# Patient Record
Sex: Male | Born: 1960
Health system: Southern US, Community
[De-identification: ages and names within clinical notes are randomized; demographics above are authoritative.]

## PROBLEM LIST (undated history)

## (undated) DIAGNOSIS — B1920 Unspecified viral hepatitis C without hepatic coma: Secondary | ICD-10-CM

## (undated) DIAGNOSIS — N200 Calculus of kidney: Secondary | ICD-10-CM

## (undated) HISTORY — DX: Calculus of kidney: N20.0

## (undated) HISTORY — PX: ANTERIOR CRUCIATE LIGAMENT REPAIR: SHX115

## (undated) HISTORY — PX: APPENDECTOMY: SHX54

## (undated) HISTORY — PX: TONSILLECTOMY: SUR1361

## (undated) HISTORY — PX: ABDOMINAL SURGERY: SHX537

## (undated) HISTORY — PX: EYE SURGERY: SHX253

---

## 2000-07-30 ENCOUNTER — Emergency Department (HOSPITAL_COMMUNITY): Admission: EM | Admit: 2000-07-30 | Discharge: 2000-07-30 | Payer: Self-pay | Admitting: Emergency Medicine

## 2000-07-30 ENCOUNTER — Encounter: Payer: Self-pay | Admitting: Emergency Medicine

## 2000-12-03 ENCOUNTER — Encounter: Payer: Self-pay | Admitting: Emergency Medicine

## 2000-12-03 ENCOUNTER — Emergency Department (HOSPITAL_COMMUNITY): Admission: EM | Admit: 2000-12-03 | Discharge: 2000-12-03 | Payer: Self-pay | Admitting: Emergency Medicine

## 2001-02-06 ENCOUNTER — Emergency Department (HOSPITAL_COMMUNITY): Admission: EM | Admit: 2001-02-06 | Discharge: 2001-02-06 | Payer: Self-pay | Admitting: *Deleted

## 2001-11-09 ENCOUNTER — Encounter: Payer: Self-pay | Admitting: Emergency Medicine

## 2001-11-09 ENCOUNTER — Emergency Department (HOSPITAL_COMMUNITY): Admission: EM | Admit: 2001-11-09 | Discharge: 2001-11-09 | Payer: Self-pay | Admitting: Emergency Medicine

## 2017-11-30 ENCOUNTER — Ambulatory Visit (INDEPENDENT_AMBULATORY_CARE_PROVIDER_SITE_OTHER): Payer: Self-pay | Admitting: Urgent Care

## 2017-11-30 ENCOUNTER — Encounter: Payer: Self-pay | Admitting: Urgent Care

## 2017-11-30 ENCOUNTER — Ambulatory Visit (INDEPENDENT_AMBULATORY_CARE_PROVIDER_SITE_OTHER): Payer: BLUE CROSS/BLUE SHIELD

## 2017-11-30 VITALS — BP 124/72 | HR 72 | Temp 98.2°F | Resp 18

## 2017-11-30 DIAGNOSIS — R1031 Right lower quadrant pain: Secondary | ICD-10-CM | POA: Diagnosis not present

## 2017-11-30 DIAGNOSIS — N201 Calculus of ureter: Secondary | ICD-10-CM | POA: Diagnosis not present

## 2017-11-30 DIAGNOSIS — R109 Unspecified abdominal pain: Secondary | ICD-10-CM

## 2017-11-30 LAB — POCT CBC
GRANULOCYTE PERCENT: 50.4 % (ref 37–80)
HCT, POC: 48 % (ref 43.5–53.7)
Hemoglobin: 15.6 g/dL (ref 14.1–18.1)
Lymph, poc: 3.9 — AB (ref 0.6–3.4)
MCH, POC: 29.7 pg (ref 27–31.2)
MCHC: 32.6 g/dL (ref 31.8–35.4)
MCV: 91.3 fL (ref 80–97)
MID (CBC): 0.9 (ref 0–0.9)
MPV: 8.6 fL (ref 0–99.8)
POC GRANULOCYTE: 4.9 (ref 2–6.9)
POC LYMPH PERCENT: 40.3 %L (ref 10–50)
POC MID %: 9.3 % (ref 0–12)
Platelet Count, POC: 224 10*3/uL (ref 142–424)
RBC: 5.25 M/uL (ref 4.69–6.13)
RDW, POC: 13.5 %
WBC: 9.7 10*3/uL (ref 4.6–10.2)

## 2017-11-30 LAB — BASIC METABOLIC PANEL
BUN / CREAT RATIO: 11 (ref 9–20)
BUN: 10 mg/dL (ref 6–24)
CO2: 22 mmol/L (ref 20–29)
Calcium: 9.4 mg/dL (ref 8.7–10.2)
Chloride: 102 mmol/L (ref 96–106)
Creatinine, Ser: 0.93 mg/dL (ref 0.76–1.27)
GFR, EST AFRICAN AMERICAN: 105 mL/min/{1.73_m2} (ref >59)
GFR, EST NON AFRICAN AMERICAN: 91 mL/min/{1.73_m2} (ref >59)
Glucose: 119 mg/dL — ABNORMAL HIGH (ref 65–99)
POTASSIUM: 4.1 mmol/L (ref 3.5–5.2)
SODIUM: 143 mmol/L (ref 134–144)

## 2017-11-30 MED ORDER — SODIUM CHLORIDE 0.9 % IV SOLN: 4.0000 mg | Freq: Once | INTRAVENOUS | Status: DC

## 2017-11-30 MED ORDER — PROMETHAZINE HCL 25 MG/ML IJ SOLN: 25.0000 mg | Freq: Four times a day (QID) | INTRAMUSCULAR | Status: DC | PRN

## 2017-11-30 MED ORDER — KETOROLAC TROMETHAMINE 60 MG/2ML IM SOLN
60.0000 mg | Freq: Once | INTRAMUSCULAR | Status: AC
Start: 2017-11-30 — End: 2017-11-30
  Administered 2017-11-30: 60 mg via INTRAMUSCULAR

## 2017-11-30 MED ORDER — PROMETHAZINE HCL 25 MG/ML IJ SOLN: 25.0000 mg | Freq: Once | INTRAMUSCULAR | 0 refills | Status: DC

## 2017-11-30 MED ORDER — ONDANSETRON 8 MG PO TBDP: 8.0000 mg | ORAL_TABLET | Freq: Three times a day (TID) | ORAL | 0 refills | Status: DC | PRN

## 2017-11-30 MED ORDER — TAMSULOSIN HCL 0.4 MG PO CAPS: 0.4000 mg | ORAL_CAPSULE | Freq: Every day | ORAL | 3 refills | Status: DC

## 2017-11-30 MED ORDER — ONDANSETRON HCL 4 MG/2ML IJ SOLN
4.0000 mg | Freq: Once | INTRAMUSCULAR | Status: AC
  Administered 2017-11-30: 4 mg via INTRAVENOUS

## 2017-11-30 MED ORDER — HYDROCODONE-ACETAMINOPHEN 5-325 MG PO TABS: 1.0000 | ORAL_TABLET | Freq: Four times a day (QID) | ORAL | 0 refills | Status: DC | PRN

## 2017-11-30 NOTE — Progress Notes (Signed)
MRN: [id number] DOB: 03/05/1961  Subjective:   Randall York is a 57 y.o. male presenting for acute onset of severe right-sided flank pain that radiates anteriorly the lateral and lower right abdomen.  Symptoms started couple of hours ago and have progressively worsened, has associated nausea with vomiting.  Patient reports a history of 2 renal stones that were small, believes that he passed them.  Denies fever, dysuria, anuria, oliguria, hematuria, chest pain, dizziness, confusion, palpitations.  Patient reports that his pain is 10 out of 10, the worst is ever experience.  He is not currently smoking cigarettes or drinking alcohol.  He has not tried any medications for relief.  Denies history of heart disease, lung disease, GI disease.  [name] is not currently taking any medications and also has No Known Allergies.  [name]  has a past medical history of Kidney stone.  Denies past surgical history. Denies family history of cancer, diabetes, HTN, HL, heart disease, stroke, mental illness.   Objective:   Vitals: BP 124/72   Pulse 72   Temp 98.2 F (36.8 C) (Oral)   Resp 18   SpO2 96%   Physical Exam  Constitutional: He is oriented to person, place, and time. He appears well-developed and well-nourished. He appears distressed (from his pain).  HENT:  Mucous membranes dry.  Eyes: Right eye exhibits no discharge. Left eye exhibits no discharge. No scleral icterus.  Cardiovascular: Normal rate, regular rhythm, normal heart sounds and intact distal pulses. Exam reveals no gallop and no friction rub.  No murmur heard. Pulmonary/Chest: Effort normal and breath sounds normal. No stridor. No respiratory distress. He has no wheezes. He has no rales.  Abdominal: Soft. Bowel sounds are normal. He exhibits no distension and no mass. There is tenderness (right flank, right mid-lower side). There is no rebound and no guarding.  Neurological: He is alert and oriented to person, place, and time.    Skin: Skin is warm and dry.   No results found for this or any previous visit (from the past 24 hour(s)). Dg Abd 1 View  Result Date: 12/02/2017 CLINICAL DATA:  57 y/o  M; right flank pain. EXAM: ABDOMEN - 1 VIEW COMPARISON:  11/30/2017 abdomen radiograph FINDINGS: 4 mm density in the right hemipelvis may represent the probable right-sided ureteral calculus identified on a prior abdomen radiographs, now migrated to the distal ureter. Normal bowel gas pattern. Bones are unremarkable. IMPRESSION: 4 mm density in the right hemipelvis may represent the probable right-sided ureteral calculus identified on a prior abdomen radiographs, now migrated to the distal ureter. Electronically Signed   By: Mitzi Hansen M.D.   On: 12/02/2017 18:03    Assessment and Plan :   Right ureteral stone  Right lower quadrant abdominal pain - Plan: ketorolac (TORADOL) injection 60 mg, ondansetron (ZOFRAN) injection 4 mg, promethazine (PHENERGAN) 25 MG/ML injection, DISCONTINUED: promethazine (PHENERGAN) injection 25 mg, DISCONTINUED: ondansetron (ZOFRAN) 4 mg in sodium chloride 0.9 % 50 mL IVPB  Right flank pain - Plan: DG Abd 1 View, POCT CBC, Basic metabolic panel  Patient stabilized with IM Toradol, Phenergan and Zofran.  His pain improved throughout the visit as well with the use of IV fluids.  Counseled on management of her renal stone including Flomax, aggressive hydration, hydrocodone for pain.  Patient is to strain his urine and return to clinic if he passes the stone for analysis.  ER return to clinic precautions reviewed.  Wallis Bamberg, PA-C Primary Care at [facility] [phone] 12/03/2017  7:42 AM

## 2017-11-30 NOTE — Patient Instructions (Addendum)
Kidney Stones Kidney stones (urolithiasis) are solid, rock-like deposits that form inside of the organs that make urine (kidneys). A kidney stone may form in a kidney and move into the bladder, where it can cause intense pain and block the flow of urine. Kidney stones are created when high levels of certain minerals are found in the urine. They are usually passed through urination, but in some cases, medical treatment may be needed to remove them. What are the causes? Kidney stones may be caused by:  A condition in which certain glands produce too much parathyroid hormone (primary hyperparathyroidism), which causes too much calcium buildup in the blood.  Buildup of uric acid crystals in the bladder (hyperuricosuria). Uric acid is a chemical that the body produces when you eat certain foods. It usually exits the body in the urine.  Narrowing (stricture) of one or both of the tubes that drain urine from the kidneys to the bladder (ureters).  A kidney blockage that is present at birth (congenital obstruction).  Past surgery on the kidney or the ureters, such as gastric bypass surgery.  What increases the risk? The following factors make you more likely to develop kidney stones:  Having had a kidney stone in the past.  Having a family history of kidney stones.  Not drinking enough water.  Eating a diet that is high in protein, salt (sodium), or sugar.  Being overweight or obese.  What are the signs or symptoms? Symptoms of a kidney stone may include:  Nausea.  Vomiting.  Blood in the urine (hematuria).  Pain in the side of the abdomen, right below the ribs (flank pain). Pain usually spreads (radiates) to the groin.  Needing to urinate frequently or urgently.  How is this diagnosed? This condition may be diagnosed based on:  Your medical history.  A physical exam.  Blood tests.  Urine tests.  CT scan.  Abdominal X-ray.  A procedure to examine the inside of the  bladder (cystoscopy).  How is this treated? Treatment for kidney stones depends on the size, location, and makeup of the stones. Treatment may involve:  Analyzing your urine before and after you pass the stone through urination.  Being monitored at the hospital until you pass the stone through urination.  Increasing your fluid intake and decreasing the amount of calcium and protein in your diet.  A procedure to break up kidney stones in the bladder using: ? A focused beam of light (laser therapy). ? Shock waves (extracorporeal shock wave lithotripsy).  Surgery to remove kidney stones. This may be needed if you have severe pain or have stones that block your urinary tract.  Follow these instructions at home: Eating and drinking   Drink enough fluid to keep your urine clear or pale yellow. This will help you to pass the kidney stone.  If directed, change your diet. This may include: ? Limiting how much sodium you eat. ? Eating more fruits and vegetables. ? Limiting how much meat, poultry, fish, and eggs you eat.  Follow instructions from your health care provider about eating or drinking restrictions. General instructions  Collect urine samples as told by your health care provider. You may need to collect a urine sample: ? 24 hours after you pass the stone. ? 8-12 weeks after passing the kidney stone, and every 6-12 months after that.  Strain your urine every time you urinate, for as long as directed. Use the strainer that your health care provider recommends.  Do not throw out   the kidney stone after passing it. Keep the stone so it can be tested by your health care provider. Testing the makeup of your kidney stone may help prevent you from getting kidney stones in the future.  Take over-the-counter and prescription medicines only as told by your health care provider.  Keep all follow-up visits as told by your health care provider. This is important. You may need follow-up  X-rays or ultrasounds to make sure that your stone has passed. How is this prevented? To prevent another kidney stone:  Drink enough fluid to keep your urine clear or pale yellow. This is the best way to prevent kidney stones.  Eat a healthy diet and follow recommendations from your health care provider about foods to avoid. You may be instructed to eat a low-protein diet. Recommendations vary depending on the type of kidney stone that you have.  Maintain a healthy weight.  Contact a health care provider if:  You have pain that gets worse or does not get better with medicine. Get help right away if:  You have a fever or chills.  You develop severe pain.  You develop new abdominal pain.  You faint.  You are unable to urinate. This information is not intended to replace advice given to you by your health care provider. Make sure you discuss any questions you have with your health care provider. Document Released: 04/12/2005 Document Revised: 10/31/2015 Document Reviewed: 09/26/2015 Elsevier Interactive Patient Education  2018 [employer].     Dietary Guidelines to Help Prevent Kidney Stones Kidney stones are deposits of minerals and salts that form inside your kidneys. Your risk of developing kidney stones may be greater depending on your diet, your lifestyle, the medicines you take, and whether you have certain medical conditions. Most people can reduce their chances of developing kidney stones by following the instructions below. Depending on your overall health and the type of kidney stones you tend to develop, your dietitian may give you more specific instructions. What are tips for following this plan? Reading food labels  Choose foods with "no salt added" or "low-salt" labels. Limit your sodium intake to less than 1500 mg per day.  Choose foods with calcium for each meal and snack. Try to eat about 300 mg of calcium at each meal. Foods that contain 200-500 mg of calcium  per serving include: ? 8 oz (237 ml) of milk, fortified nondairy milk, and fortified fruit juice. ? 8 oz (237 ml) of kefir, yogurt, and soy yogurt. ? 4 oz (118 ml) of tofu. ? 1 oz of cheese. ? 1 cup (300 g) of dried figs. ? 1 cup (91 g) of cooked broccoli. ? 1-3 oz can of sardines or mackerel.  Most people need 1000 to 1500 mg of calcium each day. Talk to your dietitian about how much calcium is recommended for you. Shopping  Buy plenty of fresh fruits and vegetables. Most people do not need to avoid fruits and vegetables, even if they contain nutrients that may contribute to kidney stones.  When shopping for convenience foods, choose: ? Whole pieces of fruit. ? Premade salads with dressing on the side. ? Low-fat fruit and yogurt smoothies.  Avoid buying frozen meals or prepared deli foods.  Look for foods with live cultures, such as yogurt and kefir. Cooking  Do not add salt to food when cooking. Place a salt shaker on the table and allow each person to add his or her own salt to taste.  Use vegetable  protein, such as beans, textured vegetable protein (TVP), or tofu instead of meat in pasta, casseroles, and soups. Meal planning  Eat less salt, if told by your dietitian. To do this: ? Avoid eating processed or premade food. ? Avoid eating fast food.  Eat less animal protein, including cheese, meat, poultry, or fish, if told by your dietitian. To do this: ? Limit the number of times you have meat, poultry, fish, or cheese each week. Eat a diet free of meat at least 2 days a week. ? Eat only one serving each day of meat, poultry, fish, or seafood. ? When you prepare animal protein, cut pieces into small portion sizes. For most meat and fish, one serving is about the size of one deck of cards.  Eat at least 5 servings of fresh fruits and vegetables each day. To do this: ? Keep fruits and vegetables on hand for snacks. ? Eat 1 piece of fruit or a handful of berries with  breakfast. ? Have a salad and fruit at lunch. ? Have two kinds of vegetables at dinner.  Limit foods that are high in a substance called oxalate. These include: ? Spinach. ? Rhubarb. ? Beets. ? Potato chips and french fries. ? Nuts.  If you regularly take a diuretic medicine, make sure to eat at least 1-2 fruits or vegetables high in potassium each day. These include: ? Avocado. ? Banana. ? Orange, prune, carrot, or tomato juice. ? Baked potato. ? Cabbage. ? Beans and split peas. General instructions  Drink enough fluid to keep your urine clear or pale yellow. This is the most important thing you can do.  Talk to your health care provider and dietitian about taking daily supplements. Depending on your health and the cause of your kidney stones, you may be advised: ? Not to take supplements with vitamin C. ? To take a calcium supplement. ? To take a daily probiotic supplement. ? To take other supplements such as magnesium, fish oil, or vitamin B6.  Take all medicines and supplements as told by your health care provider.  Limit alcohol intake to no more than 1 drink a day for nonpregnant women and 2 drinks a day for men. One drink equals 12 oz of beer, 5 oz of wine, or 1 oz of hard liquor.  Lose weight if told by your health care provider. Work with your dietitian to find strategies and an eating plan that works best for you. What foods are not recommended? Limit your intake of the following foods, or as told by your dietitian. Talk to your dietitian about specific foods you should avoid based on the type of kidney stones and your overall health. Grains Breads. Bagels. Rolls. Baked goods. Salted crackers. Cereal. Pasta. Vegetables Spinach. Rhubarb. Beets. Canned vegetables. Rosita Fire. Olives. Meats and other protein foods Nuts. Nut butters. Large portions of meat, poultry, or fish. Salted or cured meats. Deli meats. Hot dogs. Sausages. Dairy Cheese. Beverages Regular soft  drinks. Regular vegetable juice. Seasonings and other foods Seasoning blends with salt. Salad dressings. Canned soups. Soy sauce. Ketchup. Barbecue sauce. Canned pasta sauce. Casseroles. Pizza. Lasagna. Frozen meals. Potato chips. [place] fries. Summary  You can reduce your risk of kidney stones by making changes to your diet.  The most important thing you can do is drink enough fluid. You should drink enough fluid to keep your urine clear or pale yellow.  Ask your health care provider or dietitian how much protein from animal sources you should eat  each day, and also how much salt and calcium you should have each day. This information is not intended to replace advice given to you by your health care provider. Make sure you discuss any questions you have with your health care provider. Document Released: 08/07/2010 Document Revised: 03/23/2016 Document Reviewed: 03/23/2016 Elsevier Interactive Patient Education  2018 [employer]     IF you received an x-ray today, you will receive an invoice from [facility]. Please contact [facility] at [phone] with questions or concerns regarding your invoice.   IF you received labwork today, you will receive an invoice from [facility]. Please contact [facility] at [phone] with questions or concerns regarding your invoice.   Our billing staff will not be able to assist you with questions regarding bills from these companies.  You will be contacted with the lab results as soon as they are available. The fastest way to get your results is to activate your My Chart account. Instructions are located on the last page of this paperwork. If you have not heard from us regarding the results in 2 weeks, please contact this office.

## 2017-12-02 ENCOUNTER — Other Ambulatory Visit: Payer: Self-pay

## 2017-12-02 ENCOUNTER — Ambulatory Visit (INDEPENDENT_AMBULATORY_CARE_PROVIDER_SITE_OTHER): Payer: BLUE CROSS/BLUE SHIELD

## 2017-12-02 ENCOUNTER — Ambulatory Visit: Payer: BLUE CROSS/BLUE SHIELD | Admitting: Family Medicine

## 2017-12-02 ENCOUNTER — Encounter: Payer: Self-pay | Admitting: Family Medicine

## 2017-12-02 VITALS — BP 138/79 | HR 85 | Temp 98.7°F | Resp 16 | Ht 74.0 in | Wt 255.0 lb

## 2017-12-02 DIAGNOSIS — N201 Calculus of ureter: Secondary | ICD-10-CM

## 2017-12-02 DIAGNOSIS — R109 Unspecified abdominal pain: Secondary | ICD-10-CM

## 2017-12-02 DIAGNOSIS — R1031 Right lower quadrant pain: Secondary | ICD-10-CM

## 2017-12-02 LAB — POCT URINALYSIS DIP (MANUAL ENTRY)
BILIRUBIN UA: NEGATIVE
GLUCOSE UA: NEGATIVE mg/dL
Ketones, POC UA: NEGATIVE mg/dL
LEUKOCYTES UA: NEGATIVE
Nitrite, UA: NEGATIVE
PH UA: 6 (ref 5.0–8.0)
Protein Ur, POC: NEGATIVE mg/dL
Spec Grav, UA: 1.02 (ref 1.010–1.025)
Urobilinogen, UA: 1 E.U./dL

## 2017-12-02 LAB — POC MICROSCOPIC URINALYSIS (UMFC): Mucus: ABSENT

## 2017-12-02 MED ORDER — KETOROLAC TROMETHAMINE 10 MG PO TABS: 10.0000 mg | ORAL_TABLET | Freq: Four times a day (QID) | ORAL | 0 refills | Status: DC | PRN

## 2017-12-02 MED ORDER — KETOROLAC TROMETHAMINE 60 MG/2ML IM SOLN
60.0000 mg | Freq: Once | INTRAMUSCULAR | Status: AC
Start: 2017-12-02 — End: 2017-12-02
  Administered 2017-12-02: 60 mg via INTRAMUSCULAR

## 2017-12-02 MED ORDER — OXYCODONE-ACETAMINOPHEN 10-325 MG PO TABS: 1.0000 | ORAL_TABLET | ORAL | 0 refills | Status: DC | PRN

## 2017-12-02 NOTE — Progress Notes (Signed)
Subjective:    Patient ID: Randall York, male    DOB: 09/22/1960, 57 y.o.   MRN: [id number] Chief Complaint  Patient presents with  . Follow-up    per pt not" hurting as bad", but still hurting on the right side of back. I haven't passed the kidney stone yet. this am around 3:00 am got that pain. Pain scale 6-7, no where near it was the day that I came in. seen on 11/30/17    HPI  Prev diagnosed kidney stone still very bad pain.  Past Medical History:  Diagnosis Date  . Kidney stone    Past Surgical History:  Procedure Laterality Date  . ABDOMINAL SURGERY    . ANTERIOR CRUCIATE LIGAMENT REPAIR    . APPENDECTOMY    . CYSTOSCOPY WITH RETROGRADE PYELOGRAM, URETEROSCOPY AND STENT PLACEMENT Right 12/08/2017   Procedure: CYSTOSCOPY WITH RIGHT RETROGRADE URETEROSCOPY AND STENT PLACEMENT;  Surgeon: Bjorn Pippin, MD;  Location: [facility];  Service: Urology;  Laterality: Right;  . EYE SURGERY    . TONSILLECTOMY     Current Outpatient Medications on File Prior to Visit  Medication Sig Dispense Refill  . tamsulosin (FLOMAX) 0.4 MG CAPS capsule Take 1 capsule (0.4 mg total) by mouth daily. 30 capsule 3   No current facility-administered medications on file prior to visit.    No Known Allergies Family History  Problem Relation Age of Onset  . Kidney Stones Mother    Social History   Socioeconomic History  . Marital status: Married    Spouse name: Not on file  . Number of children: Not on file  . Years of education: Not on file  . Highest education level: Not on file  Occupational History  . Not on file  Social Needs  . Financial resource strain: Not on file  . Food insecurity:    Worry: Not on file    Inability: Not on file  . Transportation needs:    Medical: Not on file    Non-medical: Not on file  Tobacco Use  . Smoking status: Never Smoker  . Smokeless tobacco: Never Used  Substance and Sexual Activity  . Alcohol use: Not Currently  . Drug use:  Never  . Sexual activity: Not on file  Lifestyle  . Physical activity:    Days per week: Not on file    Minutes per session: Not on file  . Stress: Not on file  Relationships  . Social connections:    Talks on phone: Not on file    Gets together: Not on file    Attends religious service: Not on file    Active member of club or organization: Not on file    Attends meetings of clubs or organizations: Not on file    Relationship status: Not on file  Other Topics Concern  . Not on file  Social History Narrative  . Not on file   Depression screen [facility] 2/9 12/02/2017  Decreased Interest 0  Down, Depressed, Hopeless 0  PHQ - 2 Score 0   Review of Systems See hpi    Objective:   Physical Exam  Constitutional: He appears well-developed and well-nourished. He does not have a sickly appearance. He does not appear ill. He appears distressed.  HENT:  Head: Normocephalic and atraumatic.  Neck: Normal range of motion. Neck supple. No thyromegaly present.  Cardiovascular: Normal rate, regular rhythm and normal heart sounds.  Pulmonary/Chest: Effort normal and breath sounds normal.  Abdominal: Soft.  Normal appearance and bowel sounds are normal. He exhibits no distension and no mass. There is no tenderness. There is no rebound, no guarding and no CVA tenderness. No hernia.  Genitourinary: Rectum normal and prostate normal. Rectal exam shows no tenderness, anal tone normal and guaiac negative stool.  Lymphadenopathy:    He has no cervical adenopathy.  Skin: He is not diaphoretic.      BP 138/79 (BP Location: Right Arm, Patient Position: Sitting, Cuff Size: Normal)   Pulse 85   Temp 98.7 F (37.1 C) (Oral)   Resp 16   Ht 6\' 2"  (1.88 m )   Wt 255 lb (115.7 kg)   SpO2 95%   BMI 32.74 kg/m    CLINICAL DATA:  57 y/o  M; right flank pain.  EXAM: ABDOMEN - 1 VIEW  COMPARISON:  11/30/2017 abdomen radiograph  FINDINGS: 4 mm density in the right hemipelvis may represent the  probable right-sided ureteral calculus identified on a prior abdomen radiographs, now migrated to the distal ureter. Normal bowel gas pattern. Bones are unremarkable.  IMPRESSION: 4 mm density in the right hemipelvis may represent the probable right-sided ureteral calculus identified on a prior abdomen radiographs, now migrated to the distal ureter.   Electronically Signed   By: Mitzi HansenLance  Furusawa-Stratton M.D.   On: 12/02/2017 18:03  Assessment & Plan:   1. Right ureteral stone   2. Right lower quadrant abdominal pain   3. Acute right flank pain     Orders Placed This Encounter  Procedures  . Urine Culture  . Stone analysis    Standing Status:   Future    Standing Expiration Date:   12/03/2018  . DG Abd 1 View    Standing Status:   Future    Number of Occurrences:   1    Standing Expiration Date:   12/02/2018    Order Specific Question:   Reason for Exam (SYMPTOM  OR DIAGNOSIS REQUIRED)    Answer:   right flank pain continuing, recheck on nephrolithiasis progress    Order Specific Question:   Preferred imaging location?    Answer:   External  . POCT urinalysis dipstick  . POCT Microscopic Urinalysis ([facility])    Meds ordered this encounter  Medications  . ketorolac (TORADOL) injection 60 mg  . ketorolac (TORADOL) 10 MG tablet    Sig: Take 1 tablet (10 mg total) by mouth every 6 (six) hours as needed (kidney stone pain).    Dispense:  20 tablet    Refill:  0  . oxyCODONE-acetaminophen (PERCOCET) 10-325 MG tablet    Sig: Take 1 tablet by mouth every 4 (four) hours as needed for up to 4 days for pain.    Dispense:  20 tablet    Refill:  0     Norberto SorensonEva Shaw, M.D.  Primary Care at [facility]  [place] [address] [place], [place] [address] [phone] phone [phone] fax  12/02/17 5:45 PM

## 2017-12-02 NOTE — Patient Instructions (Addendum)
Do NOT take the hydrocodone.  Start the ketorolac tomorrow morning. Take the oxycodone as needed - it is 3-4x stronger than the hydrocodone.   IF you received an x-ray today, you will receive an invoice from [facility]. Please contact [facility] at [phone] with questions or concerns regarding your invoice.   IF you received labwork today, you will receive an invoice from [facility]. Please contact [facility] at [phone] with questions or concerns regarding your invoice.   Our billing staff will not be able to assist you with questions regarding bills from these companies.  You will be contacted with the lab results as soon as they are available. The fastest way to get your results is to activate your My Chart account. Instructions are located on the last page of this paperwork. If you have not heard from us regarding the results in 2 weeks, please contact this office.     Kidney Stones Kidney stones (urolithiasis) are solid, rock-like deposits that form inside of the organs that make urine (kidneys). A kidney stone may form in a kidney and move into the bladder, where it can cause intense pain and block the flow of urine. Kidney stones are created when high levels of certain minerals are found in the urine. They are usually passed through urination, but in some cases, medical treatment may be needed to remove them. What are the causes? Kidney stones may be caused by:  A condition in which certain glands produce too much parathyroid hormone (primary hyperparathyroidism), which causes too much calcium buildup in the blood.  Buildup of uric acid crystals in the bladder (hyperuricosuria). Uric acid is a chemical that the body produces when you eat certain foods. It usually exits the body in the urine.  Narrowing (stricture) of one or both of the tubes that drain urine from the kidneys to the bladder (ureters).  A kidney blockage that is present at birth (congenital  obstruction).  Past surgery on the kidney or the ureters, such as gastric bypass surgery.  What increases the risk? The following factors make you more likely to develop kidney stones:  Having had a kidney stone in the past.  Having a family history of kidney stones.  Not drinking enough water.  Eating a diet that is high in protein, salt (sodium), or sugar.  Being overweight or obese.  What are the signs or symptoms? Symptoms of a kidney stone may include:  Nausea.  Vomiting.  Blood in the urine (hematuria).  Pain in the side of the abdomen, right below the ribs (flank pain). Pain usually spreads (radiates) to the groin.  Needing to urinate frequently or urgently.  How is this diagnosed? This condition may be diagnosed based on:  Your medical history.  A physical exam.  Blood tests.  Urine tests.  CT scan.  Abdominal X-ray.  A procedure to examine the inside of the bladder (cystoscopy).  How is this treated? Treatment for kidney stones depends on the size, location, and makeup of the stones. Treatment may involve:  Analyzing your urine before and after you pass the stone through urination.  Being monitored at the hospital until you pass the stone through urination.  Increasing your fluid intake and decreasing the amount of calcium and protein in your diet.  A procedure to break up kidney stones in the bladder using: ? A focused beam of light (laser therapy). ? Shock waves (extracorporeal shock wave lithotripsy).  Surgery to remove kidney stones. This may be needed if you  have severe pain or have stones that block your urinary tract.  Follow these instructions at home: Eating and drinking   Drink enough fluid to keep your urine clear or pale yellow. This will help you to pass the kidney stone.  If directed, change your diet. This may include: ? Limiting how much sodium you eat. ? Eating more fruits and vegetables. ? Limiting how much meat,  poultry, fish, and eggs you eat.  Follow instructions from your health care provider about eating or drinking restrictions. General instructions  Collect urine samples as told by your health care provider. You may need to collect a urine sample: ? 24 hours after you pass the stone. ? 8-12 weeks after passing the kidney stone, and every 6-12 months after that.  Strain your urine every time you urinate, for as long as directed. Use the strainer that your health care provider recommends.  Do not throw out the kidney stone after passing it. Keep the stone so it can be tested by your health care provider. Testing the makeup of your kidney stone may help prevent you from getting kidney stones in the future.  Take over-the-counter and prescription medicines only as told by your health care provider.  Keep all follow-up visits as told by your health care provider. This is important. You may need follow-up X-rays or ultrasounds to make sure that your stone has passed. How is this prevented? To prevent another kidney stone:  Drink enough fluid to keep your urine clear or pale yellow. This is the best way to prevent kidney stones.  Eat a healthy diet and follow recommendations from your health care provider about foods to avoid. You may be instructed to eat a low-protein diet. Recommendations vary depending on the type of kidney stone that you have.  Maintain a healthy weight.  Contact a health care provider if:  You have pain that gets worse or does not get better with medicine. Get help right away if:  You have a fever or chills.  You develop severe pain.  You develop new abdominal pain.  You faint.  You are unable to urinate. This information is not intended to replace advice given to you by your health care provider. Make sure you discuss any questions you have with your health care provider. Document Released: 04/12/2005 Document Revised: 10/31/2015 Document Reviewed:  09/26/2015 Elsevier Interactive Patient Education  2018 [employer].  Dietary Guidelines to Help Prevent Kidney Stones Kidney stones are deposits of minerals and salts that form inside your kidneys. Your risk of developing kidney stones may be greater depending on your diet, your lifestyle, the medicines you take, and whether you have certain medical conditions. Most people can reduce their chances of developing kidney stones by following the instructions below. Depending on your overall health and the type of kidney stones you tend to develop, your dietitian may give you more specific instructions. What are tips for following this plan? Reading food labels  Choose foods with "no salt added" or "low-salt" labels. Limit your sodium intake to less than 1500 mg per day.  Choose foods with calcium for each meal and snack. Try to eat about 300 mg of calcium at each meal. Foods that contain 200-500 mg of calcium per serving include: ? 8 oz (237 ml) of milk, fortified nondairy milk, and fortified fruit juice. ? 8 oz (237 ml) of kefir, yogurt, and soy yogurt. ? 4 oz (118 ml) of tofu. ? 1 oz of cheese. ? 1 cup (  300 g) of dried figs. ? 1 cup (91 g) of cooked broccoli. ? 1-3 oz can of sardines or mackerel.  Most people need 1000 to 1500 mg of calcium each day. Talk to your dietitian about how much calcium is recommended for you. Shopping  Buy plenty of fresh fruits and vegetables. Most people do not need to avoid fruits and vegetables, even if they contain nutrients that may contribute to kidney stones.  When shopping for convenience foods, choose: ? Whole pieces of fruit. ? Premade salads with dressing on the side. ? Low-fat fruit and yogurt smoothies.  Avoid buying frozen meals or prepared deli foods.  Look for foods with live cultures, such as yogurt and kefir. Cooking  Do not add salt to food when cooking. Place a salt shaker on the table and allow each person to add his or her own salt  to taste.  Use vegetable protein, such as beans, textured vegetable protein (TVP), or tofu instead of meat in pasta, casseroles, and soups. Meal planning  Eat less salt, if told by your dietitian. To do this: ? Avoid eating processed or premade food. ? Avoid eating fast food.  Eat less animal protein, including cheese, meat, poultry, or fish, if told by your dietitian. To do this: ? Limit the number of times you have meat, poultry, fish, or cheese each week. Eat a diet free of meat at least 2 days a week. ? Eat only one serving each day of meat, poultry, fish, or seafood. ? When you prepare animal protein, cut pieces into small portion sizes. For most meat and fish, one serving is about the size of one deck of cards.  Eat at least 5 servings of fresh fruits and vegetables each day. To do this: ? Keep fruits and vegetables on hand for snacks. ? Eat 1 piece of fruit or a handful of berries with breakfast. ? Have a salad and fruit at lunch. ? Have two kinds of vegetables at dinner.  Limit foods that are high in a substance called oxalate. These include: ? Spinach. ? Rhubarb. ? Beets. ? Potato chips and french fries. ? Nuts.  If you regularly take a diuretic medicine, make sure to eat at least 1-2 fruits or vegetables high in potassium each day. These include: ? Avocado. ? Banana. ? Orange, prune, carrot, or tomato juice. ? Baked potato. ? Cabbage. ? Beans and split peas. General instructions  Drink enough fluid to keep your urine clear or pale yellow. This is the most important thing you can do.  Talk to your health care provider and dietitian about taking daily supplements. Depending on your health and the cause of your kidney stones, you may be advised: ? Not to take supplements with vitamin C. ? To take a calcium supplement. ? To take a daily probiotic supplement. ? To take other supplements such as magnesium, fish oil, or vitamin B6.  Take all medicines and supplements as  told by your health care provider.  Limit alcohol intake to no more than 1 drink a day for nonpregnant women and 2 drinks a day for men. One drink equals 12 oz of beer, 5 oz of wine, or 1 oz of hard liquor.  Lose weight if told by your health care provider. Work with your dietitian to find strategies and an eating plan that works best for you. What foods are not recommended? Limit your intake of the following foods, or as told by your dietitian. Talk to your dietitian  about specific foods you should avoid based on the type of kidney stones and your overall health. Grains Breads. Bagels. Rolls. Baked goods. Salted crackers. Cereal. Pasta. Vegetables Spinach. Rhubarb. Beets. Canned vegetables. Rosita Fire. Olives. Meats and other protein foods Nuts. Nut butters. Large portions of meat, poultry, or fish. Salted or cured meats. Deli meats. Hot dogs. Sausages. Dairy Cheese. Beverages Regular soft drinks. Regular vegetable juice. Seasonings and other foods Seasoning blends with salt. Salad dressings. Canned soups. Soy sauce. Ketchup. Barbecue sauce. Canned pasta sauce. Casseroles. Pizza. Lasagna. Frozen meals. Potato chips. [place] fries. Summary  You can reduce your risk of kidney stones by making changes to your diet.  The most important thing you can do is drink enough fluid. You should drink enough fluid to keep your urine clear or pale yellow.  Ask your health care provider or dietitian how much protein from animal sources you should eat each day, and also how much salt and calcium you should have each day. This information is not intended to replace advice given to you by your health care provider. Make sure you discuss any questions you have with your health care provider. Document Released: 08/07/2010 Document Revised: 03/23/2016 Document Reviewed: 03/23/2016 Elsevier Interactive Patient Education  [employer].

## 2017-12-03 ENCOUNTER — Encounter: Payer: Self-pay | Admitting: Urgent Care

## 2017-12-03 LAB — URINE CULTURE: ORGANISM ID, BACTERIA: NO GROWTH

## 2017-12-06 ENCOUNTER — Other Ambulatory Visit: Payer: Self-pay

## 2017-12-06 ENCOUNTER — Telehealth: Payer: Self-pay | Admitting: Urgent Care

## 2017-12-06 ENCOUNTER — Emergency Department (HOSPITAL_COMMUNITY)
Admission: EM | Admit: 2017-12-06 | Discharge: 2017-12-06 | Disposition: A | Discharge status: Home / Self Care | Payer: BLUE CROSS/BLUE SHIELD | Attending: Emergency Medicine | Admitting: Emergency Medicine

## 2017-12-06 ENCOUNTER — Encounter (HOSPITAL_COMMUNITY): Payer: Self-pay

## 2017-12-06 ENCOUNTER — Emergency Department (HOSPITAL_COMMUNITY): Payer: BLUE CROSS/BLUE SHIELD

## 2017-12-06 ENCOUNTER — Ambulatory Visit: Payer: Self-pay | Admitting: Urgent Care

## 2017-12-06 DIAGNOSIS — R109 Unspecified abdominal pain: Secondary | ICD-10-CM | POA: Diagnosis not present

## 2017-12-06 DIAGNOSIS — R112 Nausea with vomiting, unspecified: Secondary | ICD-10-CM | POA: Insufficient documentation

## 2017-12-06 DIAGNOSIS — N1330 Unspecified hydronephrosis: Secondary | ICD-10-CM

## 2017-12-06 DIAGNOSIS — N132 Hydronephrosis with renal and ureteral calculous obstruction: Secondary | ICD-10-CM | POA: Diagnosis not present

## 2017-12-06 DIAGNOSIS — N131 Hydronephrosis with ureteral stricture, not elsewhere classified: Secondary | ICD-10-CM | POA: Insufficient documentation

## 2017-12-06 DIAGNOSIS — N134 Hydroureter: Secondary | ICD-10-CM

## 2017-12-06 DIAGNOSIS — N201 Calculus of ureter: Secondary | ICD-10-CM | POA: Diagnosis not present

## 2017-12-06 DIAGNOSIS — R1031 Right lower quadrant pain: Secondary | ICD-10-CM | POA: Diagnosis not present

## 2017-12-06 LAB — BASIC METABOLIC PANEL
Anion gap: 7 (ref 5–15)
BUN: 11 mg/dL (ref 6–20)
CHLORIDE: 103 mmol/L (ref 98–111)
CO2: 27 mmol/L (ref 22–32)
Calcium: 9.1 mg/dL (ref 8.9–10.3)
Creatinine, Ser: 1.29 mg/dL — ABNORMAL HIGH (ref 0.61–1.24)
GFR calc Af Amer: >60 mL/min (ref >60)
GFR calc non Af Amer: 60 mL/min — ABNORMAL LOW (ref >60)
GLUCOSE: 110 mg/dL — AB (ref 70–99)
POTASSIUM: 3.9 mmol/L (ref 3.5–5.1)
Sodium: 137 mmol/L (ref 135–145)

## 2017-12-06 LAB — CBC WITH DIFFERENTIAL/PLATELET
Basophils Absolute: 0 10*3/uL (ref 0.0–0.1)
Basophils Relative: 0 %
EOS PCT: 4 %
Eosinophils Absolute: 0.4 10*3/uL (ref 0.0–0.7)
HCT: 43.9 % (ref 39.0–52.0)
Hemoglobin: 15 g/dL (ref 13.0–17.0)
LYMPHS ABS: 1.9 10*3/uL (ref 0.7–4.0)
LYMPHS PCT: 23 %
MCH: 30.7 pg (ref 26.0–34.0)
MCHC: 34.2 g/dL (ref 30.0–36.0)
MCV: 89.8 fL (ref 78.0–100.0)
MONOS PCT: 14 %
Monocytes Absolute: 1.2 10*3/uL — ABNORMAL HIGH (ref 0.1–1.0)
Neutro Abs: 4.9 10*3/uL (ref 1.7–7.7)
Neutrophils Relative %: 59 %
Platelets: 247 10*3/uL (ref 150–400)
RBC: 4.89 MIL/uL (ref 4.22–5.81)
RDW: 12.6 % (ref 11.5–15.5)
WBC: 8.4 10*3/uL (ref 4.0–10.5)

## 2017-12-06 LAB — URINALYSIS, ROUTINE W REFLEX MICROSCOPIC
BACTERIA UA: NONE SEEN
Bilirubin Urine: NEGATIVE
Glucose, UA: NEGATIVE mg/dL
KETONES UR: 5 mg/dL — AB
Leukocytes, UA: NEGATIVE
Nitrite: NEGATIVE
PH: 6 (ref 5.0–8.0)
Protein, ur: NEGATIVE mg/dL
SPECIFIC GRAVITY, URINE: 1.017 (ref 1.005–1.030)

## 2017-12-06 MED ORDER — ONDANSETRON 4 MG PO TBDP: 4.0000 mg | ORAL_TABLET | Freq: Three times a day (TID) | ORAL | 0 refills | Status: DC | PRN

## 2017-12-06 MED ORDER — ONDANSETRON HCL 4 MG/2ML IJ SOLN
4.0000 mg | Freq: Once | INTRAMUSCULAR | Status: AC
Start: 2017-12-06 — End: 2017-12-06
  Administered 2017-12-06: 4 mg via INTRAVENOUS
  Filled 2017-12-06: qty 2

## 2017-12-06 MED ORDER — OXYCODONE HCL 5 MG PO TABS: 10.0000 mg | ORAL_TABLET | ORAL | 0 refills | Status: AC | PRN

## 2017-12-06 MED ORDER — SODIUM CHLORIDE 0.9 % IV BOLUS
500.0000 mL | Freq: Once | INTRAVENOUS | Status: AC
  Administered 2017-12-06: 500 mL via INTRAVENOUS

## 2017-12-06 MED ORDER — MORPHINE SULFATE (PF) 4 MG/ML IV SOLN
6.0000 mg | Freq: Once | INTRAVENOUS | Status: AC
  Administered 2017-12-06: 6 mg via INTRAVENOUS
  Filled 2017-12-06: qty 2

## 2017-12-06 NOTE — ED Provider Notes (Signed)
[facility] DEPT Provider Note   CSN: [id number] Arrival date & time: 12/06/17  0915     History   Chief Complaint Chief Complaint  Patient presents with  . Flank Pain    HPI Randall York is a 57 y.o. male with history of appendectomy, ruptured ulcer requiring laparotomy, kidney stones, back surgery, is here for evaluation of right flank pain for 1 week.  Pain is severe, initially intermittent but now gradually worsening in intensity and more constant.  Pain intermittently radiates to the right lateral right lower abdomen and into the right testicle.  Associated with nausea and vomiting.  No aggravating factors.  Was seen in outside clinic twice this week for similar pain, he had an x-ray done that showed a right ureteral stone.  He has been on hydrocodone, oxycodone without relief of his pain.  He called the outside clinic this morning and was told to come to the ER.  To previous kidney stones passed on their own and has never required stents or surgery.  He has no saddle anesthesia, dysuria, hematuria, loss of bladder or bowel incontinence or retention, changes in bowel movements, loss of sensation or weakness to his extremities.  No recent falls.  No chest pain or shortness of breath.  HPI  Past Medical History:  Diagnosis Date  . Kidney stone     There are no active problems to display for this patient.   Past Surgical History:  Procedure Laterality Date  . ABDOMINAL SURGERY    . ANTERIOR CRUCIATE LIGAMENT REPAIR    . APPENDECTOMY    . EYE SURGERY          Home Medications    Prior to Admission medications   Medication Sig Start Date End Date Taking? Authorizing Provider  ketorolac (TORADOL) 10 MG tablet Take 1 tablet (10 mg total) by mouth every 6 (six) hours as needed (kidney stone pain). 12/02/17  Yes Shawnee Knapp, MD  tamsulosin (FLOMAX) 0.4 MG CAPS capsule Take 1 capsule (0.4 mg total) by mouth daily. 11/30/17  Yes Jaynee Eagles, PA-C    HYDROcodone-acetaminophen (NORCO/VICODIN) 5-325 MG tablet Take 1 tablet by mouth every 6 (six) hours as needed for moderate pain. Patient not taking: Reported on 12/06/2017 11/30/17   Jaynee Eagles, PA-C  ondansetron (ZOFRAN ODT) 4 MG disintegrating tablet Take 1 tablet (4 mg total) by mouth every 8 (eight) hours as needed for nausea or vomiting. 12/06/17   Kinnie Feil, PA-C  oxyCODONE (ROXICODONE) 5 MG immediate release tablet Take 2 tablets (10 mg total) by mouth every 4 (four) hours as needed for up to 2 days for severe pain. 12/06/17 12/08/17  Kinnie Feil, PA-C  promethazine (PHENERGAN) 25 MG/ML injection Inject 1 mL (25 mg total) into the vein once for 1 dose. 11/30/17 11/30/17  Jaynee Eagles, PA-C    Family History Family History  Problem Relation Age of Onset  . Kidney Stones Mother     Social History Social History   Tobacco Use  . Smoking status: Never Smoker  . Smokeless tobacco: Never Used  Substance Use Topics  . Alcohol use: Not Currently  . Drug use: Never     Allergies   Patient has no known allergies.   Review of Systems Review of Systems  Gastrointestinal: Positive for nausea and vomiting.  Genitourinary: Positive for flank pain.  All other systems reviewed and are negative.    Physical Exam Updated Vital Signs BP (!) 141/78 (BP Location:  Left Arm)   Pulse 89   Temp 98.1 F (36.7 C) (Oral)   Resp 17   Ht '6\' 2"'$  (1.88 m)   Wt 113.4 kg   SpO2 95%   BMI 32.10 kg/m   Physical Exam  Constitutional: He is oriented to person, place, and time. He appears well-developed and well-nourished. No distress.  Looks uncomfortable but non toxic, curled up in fetal position on bed.   HENT:  Head: Normocephalic and atraumatic.  Right Ear: External ear normal.  Left Ear: External ear normal.  Nose: Nose normal.  MMM  Eyes: Conjunctivae and EOM are normal.  Neck: Normal range of motion. Neck supple.  Cardiovascular: Normal rate, regular rhythm and normal  heart sounds.  2+ DP and radial pulses bilaterally  Pulmonary/Chest: Effort normal and breath sounds normal.  Abdominal: Soft. There is tenderness. There is CVA tenderness (right).  No pulsatility. No G/R/R. No suprapubic tenderness. Midline and RLQ surgical incisions noted.   Musculoskeletal: Normal range of motion. He exhibits no deformity.  Neurological: He is alert and oriented to person, place, and time.  Sensation to light touch intact in lower extremities. 5/5 strength with dorsiflexion and plantar flexion bilaterally  Skin: Skin is warm and dry. Capillary refill takes less than 2 seconds.  Psychiatric: He has a normal mood and affect. His behavior is normal. Judgment and thought content normal.  Nursing note and vitals reviewed.    ED Treatments / Results  Labs (all labs ordered are listed, but only abnormal results are displayed) Labs Reviewed  URINALYSIS, ROUTINE W REFLEX MICROSCOPIC - Abnormal; Notable for the following components:      Result Value   Hgb urine dipstick SMALL (*)    Ketones, ur 5 (*)    All other components within normal limits  CBC WITH DIFFERENTIAL/PLATELET - Abnormal; Notable for the following components:   Monocytes Absolute 1.2 (*)    All other components within normal limits  BASIC METABOLIC PANEL - Abnormal; Notable for the following components:   Glucose, Bld 110 (*)    Creatinine, Ser 1.29 (*)    GFR calc non Af Amer 60 (*)    All other components within normal limits    EKG None  Radiology Ct Renal Stone Study  Result Date: 12/06/2017 CLINICAL DATA:  Right flank pain radiating to right lower abdomen for 1 week. History of renal calculi. EXAM: CT ABDOMEN AND PELVIS WITHOUT CONTRAST TECHNIQUE: Multidetector CT imaging of the abdomen and pelvis was performed following the standard protocol without IV contrast. COMPARISON:  Abdomen radiograph 12/02/2017 FINDINGS: Lower chest: Left anterior descending coronary artery atherosclerosis.  Hepatobiliary: There are over 20 scattered fluid density lesions in the liver. Some of these are technically too small to characterize punctate calcification in the dome of the right hepatic lobe noted on image 8/2. Gallbladder unremarkable. Pancreas: Unremarkable Spleen: Unremarkable Adrenals/Urinary Tract: Both adrenal glands appear normal. Mild right hydronephrosis and hydroureter extending down to a 4 mm distal ureteral stone located about 1.1 cm from the right UVJ. 2 mm right mid kidney nonobstructive renal calculus on image 82/4. Stomach/Bowel: Unremarkable Vascular/Lymphatic: Aortoiliac atherosclerotic vascular disease. Reproductive: Unremarkable Other: No supplemental non-categorized findings. Musculoskeletal: Lumbar spondylosis and degenerative disc disease causing foraminal impingement bilaterally at L5-S1 and on the right at L4-5. IMPRESSION: 1. Mild right hydronephrosis and hydroureter associated with a 4 mm right distal ureteral calculus located about 1.1 cm proximal to the right UVJ. 2. 2 mm nonobstructive right mid kidney calculus. 3. Aortic Atherosclerosis (  ICD10-I70.0). Left anterior descending coronary artery atherosclerosis. 4. Lumbar impingement at L4-5 and L5-S1. 5. There are over 20 hypodense scattered relatively homogeneous fluid density lesions in the liver. These are likely cysts although some are technically too small to characterize. Electronically Signed   By: Van Clines M.D.   On: 12/06/2017 11:50    Procedures Procedures (including critical care time)  Medications Ordered in ED Medications  sodium chloride 0.9 % bolus 500 mL (500 mLs Intravenous New Bag/Given 12/06/17 1240)  ondansetron (ZOFRAN) injection 4 mg (4 mg Intravenous Given 12/06/17 1045)  morphine 4 MG/ML injection 6 mg (6 mg Intravenous Given 12/06/17 1045)     Initial Impression / Assessment and Plan / ED Course  I have reviewed the triage vital signs and the nursing notes.  Pertinent labs & imaging  results that were available during my care of the patient were reviewed by me and considered in my medical decision making (see chart for details).  Clinical Course as of Dec 07 1251  Tue Dec 06, 2017  1211 Creatinine(!): 1.29 [CG]  1211 Adrenals/Urinary Tract: Both adrenal glands appear normal. Mild right hydronephrosis and hydroureter extending down to a 4 mm distal ureteral stone located about 1.1 cm from the right UVJ.  2 mm right mid kidney nonobstructive renal calculus on image 82/4.    CT Renal Laren Everts [CG]    Clinical Course User Index [CG] Arlean Hopping   Highest suspicion for renal stone.  Less likely pyelonephritis, UTI.  He has no anterior abd tenderness. H/o appendectomy. No obvious hernias. Doubt dissection or AAA given exam, normotensive.  Will obtain screening labs, UA, CT renal, reassess.  1252: Creatinine minimally elevated.  CT confirms 4 mm distal ureteral stone on the right with associated mild right hydronephrosis and hydroureter.  Patient has been passing urine without difficulty in the ER.  His pain is adequately controlled.  No episodes of emesis here.  Patient is waiting until he cannot tolerate the pain anymore to take his oxycodone, will encourage to take it scheduled.  No indication for admission at this time.  No Sirs criteria met.  Will discharge with oxycodone, Zofran, Flomax, follow-up with urology in 48 hours.  Discussed return precautions.  Patient is in agreement. Final Clinical Impressions(s) / ED Diagnoses   Final diagnoses:  Hydronephrosis of right kidney  Hydroureter, right  Ureterolithiasis    ED Discharge Orders         Ordered    oxyCODONE (ROXICODONE) 5 MG immediate release tablet  Every 4 hours PRN     12/06/17 1245    ondansetron (ZOFRAN ODT) 4 MG disintegrating tablet  Every 8 hours PRN     12/06/17 1247           Kinnie Feil, PA-C 12/06/17 1253    Duffy Bruce, MD 12/08/17 1235

## 2017-12-06 NOTE — Discharge Instructions (Signed)
You were seen in the emergency department for continued right flank pain.  A CT confirms a 4 mm stone in the right ureter with mild associated swelling of the tube in the right kidney.  No signs of infection or significant obstruction.  Ureteral stones can take up to several weeks to completely resolve in past.  Sometimes they require intervention by urology.  Take oxycodone 10 mg every 4 hours as prescribed for the next 48 hours.  Take Zofran for nausea as needed.  Stay well-hydrated.  Continue taking Flomax.  Contact [facility] as soon as possible to schedule an appointment for reevaluation.  Return to the ER for fevers, chills, persistent vomiting despite nausea medicine, inability to urinate, burning with urination.

## 2017-12-06 NOTE — Telephone Encounter (Signed)
I called him to check on him.   See if he was able to get a ride to the ED or if he called 911.  I got his answering machine.   Did not leave a message.

## 2017-12-06 NOTE — Telephone Encounter (Signed)
Called in c/o severe right flank pain.   "I have a kidney stone on the right side".    "I've been seen twice for it and it has not moved".    "I've been in pain since last Tuesday but right now it's really bad even with the pain medicine they gave me to take".  I instructed him to go to the ED.    When asked if he had a way to go he said,  "I will ride my [place]".    I instructed him NOT to drive his [address] [place] to the ED with the severe amount of pain he is in.    He verbalized understanding and said he will call his son.   I let him know if his son can't take him then call 911 but please DO NOT drive your [place].    He is grunting and moaning in pain as he is talking to me.  [facility].  At 8:47am I called him back to see if he got his son.   He said,  "Not yet".   "I'm going to try my neighbor".   I instructed him to call 911 if he can't get his neighbor.   I let him know they can give him pain medication in the ambulance on the way to the hospital.   He verbalized understanding.   "Let me try my neighbor first".       Reason for Disposition . [1] SEVERE pain (e.g., excruciating, scale 8-10) AND [2] not improved after pain medicine  Answer Assessment - Initial Assessment Questions 1. LOCATION: "Where does it hurt?" (e.g., left, right)     Beat me in my kidney in my back on my right. 2. ONSET: "When did the pain start?"     Last Tuesday.    I've been on medication and drinking water.  Been seen twice.   Have kidney stone. 3. SEVERITY: "How bad is the pain?" (e.g., Scale 1-10; mild, moderate, or severe)   - MILD (1-3): doesn't interfere with normal activities    - MODERATE (4-7): interferes with normal activities or awakens from sleep    - SEVERE (8-10): excruciating pain and patient unable to do normal activities (stays in bed)       9 on scale 4. PATTERN: "Does the pain come and go, or is it constant?"      Constant since last Tuesday. 5. CAUSE: "What do you think  is causing the pain?"     Kidney stone.   I know   It's on the x-ray.    6. OTHER SYMPTOMS:  "Do you have any other symptoms?" (e.g., fever, abdominal pain, vomiting, leg weakness, burning with urination, blood in urine)     No urinary problems.   Blood in urine.    7. PREGNANCY:  "Is there any chance you are pregnant?" "When was your last menstrual period?"     N/A  Protocols used: FLANK PAIN-A-AH

## 2017-12-06 NOTE — ED Notes (Signed)
ED Provider at bedside. 

## 2017-12-06 NOTE — ED Triage Notes (Signed)
Patient c/o right flank pain that radiates into the right lower abdomen x 1 week. patient reports that he has a known kidney stone.  patient states he went to [facility] on Tues and Friday of last week for the same.

## 2017-12-07 ENCOUNTER — Encounter (HOSPITAL_BASED_OUTPATIENT_CLINIC_OR_DEPARTMENT_OTHER): Payer: Self-pay | Admitting: *Deleted

## 2017-12-07 ENCOUNTER — Other Ambulatory Visit: Payer: Self-pay | Admitting: Urology

## 2017-12-07 ENCOUNTER — Other Ambulatory Visit: Payer: Self-pay

## 2017-12-07 DIAGNOSIS — N201 Calculus of ureter: Secondary | ICD-10-CM | POA: Diagnosis not present

## 2017-12-07 NOTE — Progress Notes (Signed)
Patient to arrive at 1015. NPO after MN, clear liquds until 0615.may take pain/nausea med if needed AM of surgery with a sip of water. Still needs orders office aware.

## 2017-12-07 NOTE — H&P (Signed)
CC/HPI: I was consulted to assess the patient's right ureteral stone. In the last week he has had pretty consistent right Flank pain. He threw up a few days ago. He does have some intermittent nausea. He was seen in the emergency room and had a CT scan which demonstrated a 4 mm stone almost at the level the right ureterovesical junction. He has passed many stones over the years. He has not been very regular with his pain medication but since yesterday is taking oxycodone 2 tablets every 6 hours with some relief. He has not had fever. He is not prone to urinary tract infections. He has had 2 back operations   By history and review of chart I do not think he has had Toradol. They sent him home on oxycodone   There is no other aggravating or relieving factors  There is no other associated signs and symptoms  The severity of the symptoms is moderate  The symptoms are ongoing and bothersome      ALLERGIES: None   MEDICATIONS: None   GU PSH: None   NON-GU PSH: None   GU PMH: None   NON-GU PMH: None   FAMILY HISTORY: None   SOCIAL HISTORY: None   REVIEW OF SYSTEMS:    GU Review Male:   Patient denies frequent urination, hard to postpone urination, burning/ pain with urination, get up at night to urinate, leakage of urine, stream starts and stops, trouble starting your stream, have to strain to urinate , erection problems, and penile pain.  Gastrointestinal (Upper):   Patient denies nausea, vomiting, and indigestion/ heartburn.  Gastrointestinal (Lower):   Patient denies diarrhea and constipation.  Constitutional:   Patient denies fever, night sweats, weight loss, and fatigue.  Skin:   Patient denies itching and skin rash/ lesion.  Eyes:   Patient denies blurred vision and double vision.  Ears/ Nose/ Throat:   Patient denies sore throat and sinus problems.  Hematologic/Lymphatic:   Patient denies swollen glands and easy bruising.  Cardiovascular:   Patient denies leg swelling and chest  pains.  Respiratory:   Patient denies cough and shortness of breath.  Endocrine:   Patient denies excessive thirst.  Musculoskeletal:   Patient denies back pain and joint pain.  Neurological:   Patient denies headaches and dizziness.  Psychologic:   Patient denies depression and anxiety.   VITAL SIGNS:      12/07/2017 09:41 AM  Weight 250 lb / 113.4 kg  Height 74 in / 187.96 cm  BP 128/73 mmHg  Pulse 54 /min  Temperature 98.3 F / 36.8 C  BMI 32.1 kg/m   GU PHYSICAL EXAMINATION:    Penis: The patient looked uncomfortable. He did not have significant right CVA tenderness or abdominal tenderness. Abdominal examination normal.   MULTI-SYSTEM PHYSICAL EXAMINATION:    Constitutional: Well-nourished. No physical deformities. Normally developed. Good grooming.  Neck: Neck symmetrical, not swollen. Normal tracheal position.  Respiratory: No labored breathing, no use of accessory muscles.   Cardiovascular: Normal temperature, normal extremity pulses, no swelling, no varicosities.  Lymphatic: No enlargement of neck, axillae, groin.  Skin: No paleness, no jaundice, no cyanosis. No lesion, no ulcer, no rash.  Neurologic / Psychiatric: Oriented to time, oriented to place, oriented to person. No depression, no anxiety, no agitation.  Gastrointestinal: No mass, no tenderness, no rigidity, non obese abdomen.  Eyes: Normal conjunctivae. Normal eyelids.  Ears, Nose, Mouth, and Throat: Left ear no scars, no lesions, no masses. Right ear no scars,  no lesions, no masses. Nose no scars, no lesions, no masses. Normal hearing. Normal lips.  Musculoskeletal: Normal gait and station of head and neck.     PAST DATA REVIEWED:  Source Of History:  Patient   PROCEDURES:         KUB - 16109               Urinalysis Dipstick Dipstick Cont'd  Color: Yellow Bilirubin: Neg mg/dL  Appearance: Clear Ketones: Neg mg/dL  Specific Gravity: 6.045 Blood: Neg ery/uL  pH: 5.5 Protein: Neg mg/dL  Glucose: Neg  mg/dL Urobilinogen: 0.2 mg/dL    Nitrites: Neg    Leukocyte Esterase: Neg leu/uL         Ketoralac 60mg  - [id number], [id number] Patient tolerated injection well.   Qty: 60 Adm. By: Ina Homes  Unit: mg Lot No [id number]  Route: IM Exp. Date 05/26/2018  Freq: None Mfgr.: Alvogen  Site: Right Hip         Phenergan 25mg  - [id number], [id number] Patient tolerated injection well.   Qty: 25 Adm. By: Ina Homes  Unit: mg Lot No [id number]  Route: IM Exp. Date 02/24/2019  Freq: None Mfgr.: [place]  Site: Left Hip   ASSESSMENT:      ICD-10 Details  1 GU:   Ureteral calculus - N20.1           Notes:   I was consulted to assess the patient's right ureteral stone. In the last week he has had pretty consistent right Flank pain. He threw up a few days ago. He does have some intermittent nausea. He was seen in the emergency room and had a CT scan which demonstrated a 4 mm stone almost at the level the right ureterovesical junction. He has passed many stones over the years. He has not been very regular with his pain medication but since yesterday is taking oxycodone 2 tablets every 6 hours with some relief. He has not had fever. He is not prone to urinary tract infections. He has had 2 back operations   By history and review of chart I do not think he has had Toradol. They sent him home on oxycodone   The patient had a KUB. I could see the 4 mm stone approximately 1 cm proximal to the right ureteral orifice.   Picture was drawn. Watchful waiting and medical therapy discussed. I find the patient very stoic and the pain is been pretty relentless for approximately 8 days. I went over ureteroscopy with right retrograde and cystoscopy and/or stent and/or laser lithotripsy in detail. Pros cons risks discussed. Success and failure rates discussed. Ureteral injury and sequelae discussed. He would like to proceed and I think he may be a good choice. I agree with Dr. Annabell Howells that based upon the size of the stone and his mild  obesity at lithotripsy would be less ideal tomorrow. The patient was very grateful that Dr. Lacretia Nicks would proceed with the procedure tomorrow. I gave the patient 60 mg IM a Toradol and 25 mg IM of Phenergan. Patient has plenty of pain medication. It is important that the patient uses his strainer between now and surgery so thing knows he passed it. We will also be ordering a stat KUB tomorrow morning to see if he still has the stone. His case is late tomorrow morning    PLAN:           Orders Labs Urine Culture  X-Rays: KUB  Schedule         Document Letter(s):  Created for Patient: Clinical Summary

## 2017-12-08 ENCOUNTER — Ambulatory Visit (HOSPITAL_BASED_OUTPATIENT_CLINIC_OR_DEPARTMENT_OTHER): Payer: BLUE CROSS/BLUE SHIELD | Admitting: Anesthesiology

## 2017-12-08 ENCOUNTER — Ambulatory Visit (HOSPITAL_BASED_OUTPATIENT_CLINIC_OR_DEPARTMENT_OTHER)
Admission: RE | Admit: 2017-12-08 | Discharge: 2017-12-08 | Disposition: A | Discharge status: Home / Self Care | Payer: BLUE CROSS/BLUE SHIELD | Source: Ambulatory Visit | Attending: Urology | Admitting: Urology

## 2017-12-08 ENCOUNTER — Encounter (HOSPITAL_BASED_OUTPATIENT_CLINIC_OR_DEPARTMENT_OTHER)
Admission: RE | Disposition: A | Discharge status: Home / Self Care | Payer: Self-pay | Source: Ambulatory Visit | Attending: Urology

## 2017-12-08 ENCOUNTER — Ambulatory Visit (HOSPITAL_COMMUNITY): Payer: BLUE CROSS/BLUE SHIELD

## 2017-12-08 ENCOUNTER — Encounter (HOSPITAL_BASED_OUTPATIENT_CLINIC_OR_DEPARTMENT_OTHER): Payer: Self-pay | Admitting: Anesthesiology

## 2017-12-08 DIAGNOSIS — N201 Calculus of ureter: Secondary | ICD-10-CM | POA: Insufficient documentation

## 2017-12-08 HISTORY — PX: CYSTOSCOPY WITH RETROGRADE PYELOGRAM, URETEROSCOPY AND STENT PLACEMENT: SHX5789

## 2017-12-08 LAB — CBC
HCT: 41.6 % (ref 39.0–52.0)
HEMOGLOBIN: 14 g/dL (ref 13.0–17.0)
MCH: 30.3 pg (ref 26.0–34.0)
MCHC: 33.7 g/dL (ref 30.0–36.0)
MCV: 90 fL (ref 78.0–100.0)
PLATELETS: 250 10*3/uL (ref 150–400)
RBC: 4.62 MIL/uL (ref 4.22–5.81)
RDW: 12.8 % (ref 11.5–15.5)
WBC: 7 10*3/uL (ref 4.0–10.5)

## 2017-12-08 LAB — BASIC METABOLIC PANEL
Anion gap: 9 (ref 5–15)
BUN: 14 mg/dL (ref 6–20)
CO2: 27 mmol/L (ref 22–32)
CREATININE: 1.38 mg/dL — AB (ref 0.61–1.24)
Calcium: 9 mg/dL (ref 8.9–10.3)
Chloride: 102 mmol/L (ref 98–111)
GFR calc Af Amer: >60 mL/min (ref >60)
GFR, EST NON AFRICAN AMERICAN: 55 mL/min — AB (ref >60)
Glucose, Bld: 112 mg/dL — ABNORMAL HIGH (ref 70–99)
POTASSIUM: 4.1 mmol/L (ref 3.5–5.1)
SODIUM: 138 mmol/L (ref 135–145)

## 2017-12-08 SURGERY — CYSTOURETEROSCOPY, WITH RETROGRADE PYELOGRAM AND STENT INSERTION
Anesthesia: General | Site: Renal | Laterality: Right

## 2017-12-08 MED ORDER — KETOROLAC TROMETHAMINE 30 MG/ML IJ SOLN
INTRAMUSCULAR | Status: DC | PRN
  Administered 2017-12-08: 30 mg via INTRAVENOUS

## 2017-12-08 MED ORDER — PROPOFOL 10 MG/ML IV BOLUS
INTRAVENOUS | Status: AC
  Filled 2017-12-08: qty 40

## 2017-12-08 MED ORDER — LIDOCAINE 2% (20 MG/ML) 5 ML SYRINGE
INTRAMUSCULAR | Status: DC | PRN
  Administered 2017-12-08: 100 mg via INTRAVENOUS

## 2017-12-08 MED ORDER — BELLADONNA ALKALOIDS-OPIUM 16.2-60 MG RE SUPP
RECTAL | Status: AC
  Filled 2017-12-08: qty 1

## 2017-12-08 MED ORDER — PROMETHAZINE HCL 25 MG/ML IJ SOLN
6.2500 mg | INTRAMUSCULAR | Status: DC | PRN
  Filled 2017-12-08: qty 1

## 2017-12-08 MED ORDER — CIPROFLOXACIN IN D5W 400 MG/200ML IV SOLN
400.0000 mg | INTRAVENOUS | Status: AC
  Administered 2017-12-08: 400 mg via INTRAVENOUS
  Filled 2017-12-08: qty 200

## 2017-12-08 MED ORDER — MIDAZOLAM HCL 2 MG/2ML IJ SOLN
INTRAMUSCULAR | Status: DC | PRN
  Administered 2017-12-08: 2 mg via INTRAVENOUS

## 2017-12-08 MED ORDER — OXYCODONE HCL 5 MG PO TABS
5.0000 mg | ORAL_TABLET | Freq: Once | ORAL | Status: DC | PRN
  Filled 2017-12-08: qty 1

## 2017-12-08 MED ORDER — CIPROFLOXACIN IN D5W 400 MG/200ML IV SOLN
INTRAVENOUS | Status: AC
  Filled 2017-12-08: qty 200

## 2017-12-08 MED ORDER — BELLADONNA ALKALOIDS-OPIUM 16.2-60 MG RE SUPP
RECTAL | Status: DC | PRN
  Administered 2017-12-08: 1 via RECTAL

## 2017-12-08 MED ORDER — FENTANYL CITRATE (PF) 100 MCG/2ML IJ SOLN
INTRAMUSCULAR | Status: DC | PRN
  Administered 2017-12-08 (×2): 50 ug via INTRAVENOUS

## 2017-12-08 MED ORDER — ONDANSETRON HCL 4 MG/2ML IJ SOLN
INTRAMUSCULAR | Status: DC | PRN
  Administered 2017-12-08: 4 mg via INTRAVENOUS

## 2017-12-08 MED ORDER — SODIUM CHLORIDE 0.9 % IR SOLN
Status: DC | PRN
  Administered 2017-12-08: 3000 mL

## 2017-12-08 MED ORDER — OXYCODONE HCL 5 MG/5ML PO SOLN
5.0000 mg | Freq: Once | ORAL | Status: DC | PRN
  Filled 2017-12-08: qty 5

## 2017-12-08 MED ORDER — HYDROMORPHONE HCL 1 MG/ML IJ SOLN
0.2500 mg | INTRAMUSCULAR | Status: DC | PRN
  Filled 2017-12-08: qty 0.5

## 2017-12-08 MED ORDER — IOHEXOL 300 MG/ML  SOLN
INTRAMUSCULAR | Status: DC | PRN
  Administered 2017-12-08: 15 mL

## 2017-12-08 MED ORDER — FENTANYL CITRATE (PF) 100 MCG/2ML IJ SOLN
INTRAMUSCULAR | Status: AC
  Filled 2017-12-08: qty 2

## 2017-12-08 MED ORDER — MIDAZOLAM HCL 2 MG/2ML IJ SOLN
INTRAMUSCULAR | Status: AC
  Filled 2017-12-08: qty 2

## 2017-12-08 MED ORDER — PHENAZOPYRIDINE HCL 200 MG PO TABS: 200.0000 mg | ORAL_TABLET | Freq: Three times a day (TID) | ORAL | 0 refills | Status: DC | PRN

## 2017-12-08 MED ORDER — LACTATED RINGERS IV SOLN
INTRAVENOUS | Status: DC
  Administered 2017-12-08: 1000 mL via INTRAVENOUS
  Filled 2017-12-08: qty 1000

## 2017-12-08 MED ORDER — PROPOFOL 10 MG/ML IV BOLUS
INTRAVENOUS | Status: DC | PRN
  Administered 2017-12-08: 200 mg via INTRAVENOUS

## 2017-12-08 MED ORDER — DEXAMETHASONE SODIUM PHOSPHATE 10 MG/ML IJ SOLN
INTRAMUSCULAR | Status: DC | PRN
  Administered 2017-12-08: 10 mg via INTRAVENOUS

## 2017-12-08 SURGICAL SUPPLY — 32 items
BAG DRAIN URO-CYSTO SKYTR STRL (DRAIN) ×2 IMPLANT
BASKET STONE 1.7 NGAGE (UROLOGICAL SUPPLIES) ×2 IMPLANT
BASKET ZERO TIP NITINOL 2.4FR (BASKET) IMPLANT
CATH URET 5FR 28IN CONE TIP (BALLOONS)
CATH URET 5FR 28IN OPEN ENDED (CATHETERS) IMPLANT
CATH URET 5FR 70CM CONE TIP (BALLOONS) IMPLANT
CATH URET DUAL LUMEN 6-10FR 50 (CATHETERS) ×2 IMPLANT
CLOTH BEACON ORANGE TIMEOUT ST (SAFETY) ×2 IMPLANT
ELECT REM PT RETURN 9FT ADLT (ELECTROSURGICAL)
ELECTRODE REM PT RTRN 9FT ADLT (ELECTROSURGICAL) IMPLANT
FIBER LASER FLEXIVA 365 (UROLOGICAL SUPPLIES) IMPLANT
FIBER LASER TRAC TIP (UROLOGICAL SUPPLIES) IMPLANT
GLOVE BIOGEL PI IND STRL 8.5 (GLOVE) ×2 IMPLANT
GLOVE BIOGEL PI INDICATOR 8.5 (GLOVE) ×2
GLOVE INDICATOR 8.5 STRL (GLOVE) ×2 IMPLANT
GLOVE SURG SS PI 8.0 STRL IVOR (GLOVE) ×2 IMPLANT
GOWN STRL REUS W/TWL XL LVL3 (GOWN DISPOSABLE) ×4 IMPLANT
GUIDEWIRE 0.038 PTFE COATED (WIRE) IMPLANT
GUIDEWIRE ANG ZIPWIRE 038X150 (WIRE) IMPLANT
GUIDEWIRE STR DUAL SENSOR (WIRE) ×2 IMPLANT
INFUSOR MANOMETER BAG 3000ML (MISCELLANEOUS) ×2 IMPLANT
IV NS IRRIG 3000ML ARTHROMATIC (IV SOLUTION) ×2 IMPLANT
KIT BALLN UROMAX 15FX4 (MISCELLANEOUS) ×1 IMPLANT
KIT BALLN UROMAX 26 75X4 (MISCELLANEOUS) ×1
KIT TURNOVER CYSTO (KITS) ×2 IMPLANT
MANIFOLD NEPTUNE II (INSTRUMENTS) ×2 IMPLANT
NS IRRIG 500ML POUR BTL (IV SOLUTION) ×2 IMPLANT
PACK CYSTO (CUSTOM PROCEDURE TRAY) ×2 IMPLANT
STENT URET 6FRX26 CONTOUR (STENTS) ×2 IMPLANT
TUBE CONNECTING 12X1/4 (SUCTIONS) ×2 IMPLANT
TUBING UROLOGY SET (TUBING) ×2 IMPLANT
boston scientific uromax kit 15 f x 4 cm ×2 IMPLANT

## 2017-12-08 NOTE — Anesthesia Preprocedure Evaluation (Addendum)
Anesthesia Evaluation  Patient identified by MRN, date of birth, ID band Patient awake    Reviewed: Allergy & Precautions, NPO status , Patient's Chart, lab work & pertinent test results  Airway Mallampati: II  TM Distance: >3 FB Neck ROM: Full    Dental no notable dental hx. (+) Teeth Intact, Dental Advisory Given,    Pulmonary neg pulmonary ROS,    Pulmonary exam normal breath sounds clear to auscultation       Cardiovascular negative cardio ROS Normal cardiovascular exam Rhythm:Regular Rate:Normal     Neuro/Psych negative neurological ROS  negative psych ROS   GI/Hepatic negative GI ROS, Neg liver ROS,   Endo/Other  negative endocrine ROS  Renal/GU Renal diseasenegative Renal ROS  negative genitourinary   Musculoskeletal negative musculoskeletal ROS (+)   Abdominal (+) + obese,   Peds negative pediatric ROS (+)  Hematology negative hematology ROS (+)   Anesthesia Other Findings   Reproductive/Obstetrics negative OB ROS                           Anesthesia Physical Anesthesia Plan  ASA: II  Anesthesia Plan: General   Post-op Pain Management:    Induction: Intravenous  PONV Risk Score and Plan: 2 and Ondansetron and Midazolam  Airway Management Planned: LMA  Additional Equipment:   Intra-op Plan:   Post-operative Plan: Extubation in OR  Informed Consent: I have reviewed the patients History and Physical, chart, labs and discussed the procedure including the risks, benefits and alternatives for the proposed anesthesia with the patient or authorized representative who has indicated his/her understanding and acceptance.   Dental advisory given  Plan Discussed with: CRNA  Anesthesia Plan Comments:         Anesthesia Quick Evaluation

## 2017-12-08 NOTE — Op Note (Signed)
Procedure: 1.  Cystoscopy with right retrograde pyelogram and interpretation. 2.  Right ureteroscopic stone extraction. 3.  Insertion of right double-J stent.  Preop diagnosis: Right distal ureteral stone.  Postop diagnosis: Same.  Surgeon: Dr. Irine Seal.  Anesthesia: General.  Specimen: Stone.  Drain: 6 [place] by 26 cm right contour double-J stent with tether.  EBL: Minimal.  Anesthesia: General.  Indications: The patient is a 57 year old white male with a 3 mm right distal ureteral stone is elected ureteroscopic stone extraction.  Procedure: He was given Cipro.  A general anesthetic was induced.  He was placed in lithotomy position and fitted with PAS hose.  His perineum and genitalia were prepped with Betadine solution and draped usual sterile fashion.  Cystoscopy was performed using a 23 [place] scope and 30 degree lens.  Examination revealed a normal urethra.  The external sphincter was intact.  The prostatic urethra was approximately 2 to 3 cm in length with bilobar hyperplasia with minimal obstruction.  Examination of bladder revealed mild trabeculation and no mucosal lesions.  The ureteral orifices were in the normal anatomic position.  The right ureteral orifice was cannulated with a 5 [place] open end catheter and contrast was instilled.    the right retrograde pyelogram demonstrated a narrow intramural ureter with a filling defects proximal and mild dilation above the filling defect which was consistent with a stone.  A sensor guidewire was then passed through the open end catheter with some resistance by the stone but I was able to advance it to the kidney.  The cystoscope was then removed and the 4.5 French semirigid ureteroscope was then passed alongside the wire.  Initially there was some resistance to passage so the ureteroscope was removed and the 8 French dual-lumen ureteral catheter was passed over the wire however it would not go beyond the intramural ureter and I did  not force it.  The ureteroscope was then reinserted and I was able to guide it alongside the wire to the level of the stone and during this maneuver and noted that the sensor wire had gone submucosal for approximately a centimeter and a half below the level of the stone.  I removed the wire and reinserted it through the scope.  Ureteroscope was then removed and a 4 cm 15 French high-pressure balloon was passed over the wire and the distal ureter was dilated to 18 atm.  The ureteroscope was then reinserted and the stone was readily visualized and grasped with an engage basket and removed.  The ureteroscope was reinserted and the ureter was inspected no significant ureteral injury was identified but I felt stenting was indicated.  The cystoscope was then reinserted over the wire and a 6 [place] by 26 cm contour double-J stent was passed to the kidney under fluoroscopic guidance.  The wire was removed leaving a good coil in the kidney and a good coil in the bladder.  The cystoscope was removed leaving the stent string exiting the urethra.  The bladder was then partially drained prior to removal.  A B&O suppository was placed and the stent string was secured to the patient's penis.  He was taken down from lithotomy position, his anesthetic was reversed and he was moved to recovery room in stable condition.  There were no complications.  He will be instructed to bring the stone to the office.

## 2017-12-08 NOTE — Anesthesia Postprocedure Evaluation (Signed)
Anesthesia Post Note  Patient: Randall York  Procedure(s) Performed: CYSTOSCOPY WITH RIGHT RETROGRADE URETEROSCOPY AND STENT PLACEMENT (Right Renal)     Patient location during evaluation: PACU Anesthesia Type: General Level of consciousness: awake and sedated Pain management: pain level controlled Vital Signs Assessment: post-procedure vital signs reviewed and stable Respiratory status: spontaneous breathing Cardiovascular status: stable Postop Assessment: no apparent nausea or vomiting Anesthetic complications: no    Last Vitals:  Vitals:   12/08/17 1313 12/08/17 1315  BP: 123/68 123/68  Pulse: 69 68  Resp: 19 19  Temp:    SpO2: 95% 93%    Last Pain:  Vitals:   12/08/17 1315  TempSrc:   PainSc: Asleep   Pain Goal: Patients Stated Pain Goal: 8 (12/08/17 1115)               Judah Chevere JR,JOHN Susann GivensFRANKLIN

## 2017-12-08 NOTE — Discharge Instructions (Addendum)
Ureteral Stent Implantation, Care After Refer to this sheet in the next few weeks. These instructions provide you with information about caring for yourself after your procedure. Your health care provider may also give you more specific instructions. Your treatment has been planned according to current medical practices, but problems sometimes occur. Call your health care provider if you have any problems or questions after your procedure. What can I expect after the procedure? After the procedure, it is common to have:  Nausea.  Mild pain when you urinate. You may feel this pain in your lower back or lower abdomen. Pain should stop within a few minutes after you urinate. This may last for up to 1 week.  A small amount of blood in your urine for several days.  Follow these instructions at home:  Medicines  Take over-the-counter and prescription medicines only as told by your health care provider.  If you were prescribed an antibiotic medicine, take it as told by your health care provider. Do not stop taking the antibiotic even if you start to feel better.  Do not drive for 24 hours if you received a sedative.  Do not drive or operate heavy machinery while taking prescription pain medicines.  Do noto take any nonsteroidal anti inflammatories (Ibuprofen, Advil, Aleve, Motrin) until after 6:30 pm today. Activity  Return to your normal activities as told by your health care provider. Ask your health care provider what activities are safe for you.  Do not lift anything that is heavier than 10 lb (4.5 kg). Follow this limit for 1 week after your procedure, or for as long as told by your health care provider. General instructions  Watch for any blood in your urine. Call your health care provider if the amount of blood in your urine increases.  If you have a catheter: ? Follow instructions from your health care provider about taking care of your catheter and collection bag. ? Do not take  baths, swim, or use a hot tub until your health care provider approves.  Drink enough fluid to keep your urine clear or pale yellow.  Keep all follow-up visits as told by your health care provider. This is important. Contact a health care provider if:  You have pain that gets worse or does not get better with medicine, especially pain when you urinate.  You have difficulty urinating.  You feel nauseous or you vomit repeatedly during a period of more than 2 days after the procedure. Get help right away if:  Your urine is dark red or has blood clots in it.  You are leaking urine (have incontinence).  The end of the stent comes out of your urethra.  You cannot urinate.  You have sudden, sharp, or severe pain in your abdomen or lower back.  You have a fever.  You can remove your stent by pulling the attached string on Monday and if you don't feel comfortable doing that you can call to come to the office to have it done.  Please bring your stone to the office for analysis.   This information is not intended to replace advice given to you by your health care provider. Make sure you discuss any questions you have with your health care provider. Document Released: 12/13/2012 Document Revised: 09/18/2015 Document Reviewed: 10/25/2014 Elsevier Interactive Patient Education  2018 [employer]      Post Anesthesia Home Care Instructions  Activity: Get plenty of rest for the remainder of the day. A responsible individual must  stay with you for 24 hours following the procedure.  For the next 24 hours, DO NOT: -Drive a car -Advertising copywriterperate machinery -Drink alcoholic beverages -Take any medication unless instructed by your physician -Make any legal decisions or sign important papers.  Meals: Start with liquid foods such as gelatin or soup. Progress to regular foods as tolerated. Avoid greasy, spicy, heavy foods. If nausea and/or vomiting occur, drink only clear liquids until the nausea  and/or vomiting subsides. Call your physician if vomiting continues.  Special Instructions/Symptoms: Your throat may feel dry or sore from the anesthesia or the breathing tube placed in your throat during surgery. If this causes discomfort, gargle with warm salt water. The discomfort should disappear within 24 hours.

## 2017-12-08 NOTE — Transfer of Care (Signed)
Immediate Anesthesia Transfer of Care Note  Patient: Randall York  Procedure(s) Performed: CYSTOSCOPY WITH RIGHT RETROGRADE URETEROSCOPY AND STENT PLACEMENT (Right Renal)  Patient Location: PACU  Anesthesia Type:General  Level of Consciousness: awake, alert , oriented and patient cooperative  Airway & Oxygen Therapy: Patient Spontanous Breathing and Patient connected to nasal cannula oxygen  Post-op Assessment: Report given to RN and Post -op Vital signs reviewed and stable  Post vital signs: Reviewed and stable  Last Vitals:  Vitals Value Taken Time  BP    Temp    Pulse    Resp    SpO2      Last Pain:  Vitals:   12/08/17 1115  TempSrc:   PainSc: 8       Patients Stated Pain Goal: 8 (12/08/17 1115)  Complications: No apparent anesthesia complications

## 2017-12-08 NOTE — Interval H&P Note (Signed)
History and Physical Interval Note:  He continues to have pain.  The stone is hard to see on KUB but had low HU's on CT and I believe it is unchanged.  We will proceed with the procedure.   12/08/2017 10:57 AM  Randall York  has presented today for surgery, with the diagnosis of RIGHT URETERAL STONE  The various methods of treatment have been discussed with the patient and family. After consideration of risks, benefits and other options for treatment, the patient has consented to  Procedure(s): CYSTOSCOPY WITH RIGHT RETROGRADE URETEROSCOPY LASER LITHOTRIPSY AND STENT PLACEMENT (Right) as a surgical intervention .  The patient's history has been reviewed, patient examined, no change in status, stable for surgery.  I have reviewed the patient's chart and labs.  Questions were answered to the patient's satisfaction.     Randall York

## 2017-12-08 NOTE — Anesthesia Procedure Notes (Signed)
Procedure Name: LMA Insertion Date/Time: 12/08/2017 12:12 PM Performed by: Tyrone NineSauve, Inette Doubrava F, CRNA Pre-anesthesia Checklist: Patient identified, Timeout performed, Emergency Drugs available, Suction available and Patient being monitored Patient Re-evaluated:Patient Re-evaluated prior to induction Oxygen Delivery Method: Circle system utilized Preoxygenation: Pre-oxygenation with 100% oxygen Induction Type: IV induction Ventilation: Mask ventilation without difficulty LMA: LMA inserted LMA Size: 4.0 Number of attempts: 1 Airway Equipment and Method: Bite block (Gauze bite block placed) Placement Confirmation: positive ETCO2,  CO2 detector and breath sounds checked- equal and bilateral Tube secured with: Tape Dental Injury: Teeth and Oropharynx as per pre-operative assessment

## 2017-12-09 ENCOUNTER — Encounter (HOSPITAL_BASED_OUTPATIENT_CLINIC_OR_DEPARTMENT_OTHER): Payer: Self-pay | Admitting: Urology

## 2018-09-18 ENCOUNTER — Other Ambulatory Visit: Payer: Self-pay

## 2018-09-18 ENCOUNTER — Emergency Department (HOSPITAL_COMMUNITY)
Admission: EM | Admit: 2018-09-18 | Discharge: 2018-09-18 | Disposition: A | Discharge status: Home / Self Care | Payer: BLUE CROSS/BLUE SHIELD | Attending: Emergency Medicine | Admitting: Emergency Medicine

## 2018-09-18 ENCOUNTER — Encounter (HOSPITAL_COMMUNITY): Payer: Self-pay | Admitting: *Deleted

## 2018-09-18 DIAGNOSIS — W57XXXA Bitten or stung by nonvenomous insect and other nonvenomous arthropods, initial encounter: Secondary | ICD-10-CM | POA: Diagnosis not present

## 2018-09-18 DIAGNOSIS — Y929 Unspecified place or not applicable: Secondary | ICD-10-CM | POA: Diagnosis not present

## 2018-09-18 DIAGNOSIS — Y999 Unspecified external cause status: Secondary | ICD-10-CM | POA: Insufficient documentation

## 2018-09-18 DIAGNOSIS — S80861A Insect bite (nonvenomous), right lower leg, initial encounter: Secondary | ICD-10-CM | POA: Diagnosis not present

## 2018-09-18 DIAGNOSIS — Y939 Activity, unspecified: Secondary | ICD-10-CM | POA: Diagnosis not present

## 2018-09-18 DIAGNOSIS — S70361A Insect bite (nonvenomous), right thigh, initial encounter: Secondary | ICD-10-CM | POA: Insufficient documentation

## 2018-09-18 MED ORDER — SULFAMETHOXAZOLE-TRIMETHOPRIM 800-160 MG PO TABS
1.0000 | ORAL_TABLET | Freq: Two times a day (BID) | ORAL | 0 refills | Status: AC
Start: 2018-09-18 — End: 2018-09-25

## 2018-09-18 NOTE — Discharge Instructions (Addendum)
Bactrim as prescribed.  Local wound care with bacitracin and Band-Aid twice daily.  Follow-up with primary doctor if not improving in the next few days.

## 2018-09-18 NOTE — ED Triage Notes (Signed)
Pt with insect bite to right upper thigh for past two nights, burns and hurts per pt.

## 2018-09-23 NOTE — ED Provider Notes (Signed)
[facility] EMERGENCY DEPARTMENT Provider Note   CSN: [id number] Arrival date & time: 09/18/18  1300    History   Chief Complaint Chief Complaint  Patient presents with  . Insect Bite    HPI Weston BrassRandall L Mohs is a 58 y.o. male.     Patient is a 58 year old male with no significant past medical history.  He presents with redness and swelling to the lateral aspect of his right upper thigh.  He believes he was bit by an insect.  He denies any fevers or chills.  He denies any headache, chest pain, or difficulty breathing.  There are no aggravating or alleviating factors.  The history is provided by the patient.    Past Medical History:  Diagnosis Date  . Kidney stone     There are no active problems to display for this patient.   Past Surgical History:  Procedure Laterality Date  . ABDOMINAL SURGERY    . ANTERIOR CRUCIATE LIGAMENT REPAIR    . APPENDECTOMY    . CYSTOSCOPY WITH RETROGRADE PYELOGRAM, URETEROSCOPY AND STENT PLACEMENT Right 12/08/2017   Procedure: CYSTOSCOPY WITH RIGHT RETROGRADE URETEROSCOPY AND STENT PLACEMENT;  Surgeon: Bjorn PippinWrenn, John, MD;  [facility];  Service: Urology;  Laterality: Right;  . EYE SURGERY    . TONSILLECTOMY          Home Medications    Prior to Admission medications   Medication Sig Start Date End Date Taking? Authorizing Provider  ketorolac (TORADOL) 10 MG tablet Take 1 tablet (10 mg total) by mouth every 6 (six) hours as needed (kidney stone pain). 12/02/17   Sherren MochaShaw, Eva N, MD  ondansetron (ZOFRAN ODT) 4 MG disintegrating tablet Take 1 tablet (4 mg total) by mouth every 8 (eight) hours as needed for nausea or vomiting. 12/06/17   Liberty HandyGibbons, Claudia J, PA-C  phenazopyridine (PYRIDIUM) 200 MG tablet Take 1 tablet (200 mg total) by mouth 3 (three) times daily as needed for pain. 12/08/17   Bjorn PippinWrenn, John, MD  sulfamethoxazole-trimethoprim (BACTRIM DS) 800-160 MG tablet Take 1 tablet by mouth 2 (two) times daily for 7 days.  09/18/18 09/25/18  Geoffery Lyonselo, Jayshon Dommer, MD  tamsulosin (FLOMAX) 0.4 MG CAPS capsule Take 1 capsule (0.4 mg total) by mouth daily. 11/30/17   Wallis BambergMani, Mario, PA-C    Family History Family History  Problem Relation Age of Onset  . Kidney Stones Mother     Social History Social History   Tobacco Use  . Smoking status: Never Smoker  . Smokeless tobacco: Never Used  Substance Use Topics  . Alcohol use: Not Currently  . Drug use: Never     Allergies   Patient has no known allergies.   Review of Systems Review of Systems  All other systems reviewed and are negative.    Physical Exam Updated Vital Signs BP 123/84 (BP Location: Right Arm)   Pulse (!) 103   Temp 97.8 F (36.6 C) (Temporal)   Resp 18   Ht 6\' 2"  (1.88 m)   Wt 100.7 kg   SpO2 97%   BMI 28.50 kg/m   Physical Exam Vitals signs and nursing note reviewed.  Constitutional:      General: He is not in acute distress.    Appearance: Normal appearance. He is not ill-appearing.  HENT:     Head: Normocephalic and atraumatic.  Pulmonary:     Effort: Pulmonary effort is normal.  Skin:    General: Skin is warm and dry.     Comments:  There is a small, 2 cm round area of erythema to the right upper thigh. No fluctuance or red streaks are noted.  Neurological:     General: No focal deficit present.     Mental Status: He is alert.      ED Treatments / Results  Labs (all labs ordered are listed, but only abnormal results are displayed) Labs Reviewed - No data to display  EKG None  Radiology No results found.  Procedures Procedures (including critical care time)  Medications Ordered in ED Medications - No data to display   Initial Impression / Assessment and Plan / ED Course  I have reviewed the triage vital signs and the nursing notes.  Pertinent labs & imaging results that were available during my care of the patient were reviewed by me and considered in my medical decision making (see chart for details).   This appears to be a developing cellulitis.  This will be treated with antibiotics.  Patient is to follow-up as needed if not improving.  Final Clinical Impressions(s) / ED Diagnoses   Final diagnoses:  Bug bite with infection, initial encounter    ED Discharge Orders         Ordered    sulfamethoxazole-trimethoprim (BACTRIM DS) 800-160 MG tablet  2 times daily     09/18/18 1322           Geoffery Lyons, MD 09/23/18 [phone]

## 2018-11-15 ENCOUNTER — Encounter (HOSPITAL_COMMUNITY): Payer: Self-pay

## 2018-11-15 ENCOUNTER — Emergency Department (HOSPITAL_COMMUNITY)
Admission: EM | Admit: 2018-11-15 | Discharge: 2018-11-16 | Disposition: A | Discharge status: Home / Self Care | Payer: BLUE CROSS/BLUE SHIELD | Attending: Emergency Medicine | Admitting: Emergency Medicine

## 2018-11-15 ENCOUNTER — Other Ambulatory Visit: Payer: Self-pay

## 2018-11-15 ENCOUNTER — Emergency Department (HOSPITAL_COMMUNITY): Payer: BLUE CROSS/BLUE SHIELD

## 2018-11-15 DIAGNOSIS — Y929 Unspecified place or not applicable: Secondary | ICD-10-CM | POA: Insufficient documentation

## 2018-11-15 DIAGNOSIS — S81011A Laceration without foreign body, right knee, initial encounter: Secondary | ICD-10-CM | POA: Diagnosis not present

## 2018-11-15 DIAGNOSIS — S40212A Abrasion of left shoulder, initial encounter: Secondary | ICD-10-CM | POA: Insufficient documentation

## 2018-11-15 DIAGNOSIS — R0689 Other abnormalities of breathing: Secondary | ICD-10-CM | POA: Diagnosis not present

## 2018-11-15 DIAGNOSIS — S80211A Abrasion, right knee, initial encounter: Secondary | ICD-10-CM | POA: Diagnosis not present

## 2018-11-15 DIAGNOSIS — S60511A Abrasion of right hand, initial encounter: Secondary | ICD-10-CM | POA: Diagnosis not present

## 2018-11-15 DIAGNOSIS — S6991XA Unspecified injury of right wrist, hand and finger(s), initial encounter: Secondary | ICD-10-CM | POA: Diagnosis not present

## 2018-11-15 DIAGNOSIS — S20412A Abrasion of left back wall of thorax, initial encounter: Secondary | ICD-10-CM | POA: Insufficient documentation

## 2018-11-15 DIAGNOSIS — S60811A Abrasion of right wrist, initial encounter: Secondary | ICD-10-CM | POA: Diagnosis not present

## 2018-11-15 DIAGNOSIS — S60512A Abrasion of left hand, initial encounter: Secondary | ICD-10-CM | POA: Diagnosis not present

## 2018-11-15 DIAGNOSIS — R52 Pain, unspecified: Secondary | ICD-10-CM | POA: Diagnosis not present

## 2018-11-15 DIAGNOSIS — T07XXXA Unspecified multiple injuries, initial encounter: Secondary | ICD-10-CM

## 2018-11-15 DIAGNOSIS — T148XXA Other injury of unspecified body region, initial encounter: Secondary | ICD-10-CM

## 2018-11-15 DIAGNOSIS — Y939 Activity, unspecified: Secondary | ICD-10-CM | POA: Insufficient documentation

## 2018-11-15 DIAGNOSIS — Z5320 Procedure and treatment not carried out because of patient's decision for unspecified reasons: Secondary | ICD-10-CM | POA: Insufficient documentation

## 2018-11-15 DIAGNOSIS — S60812A Abrasion of left wrist, initial encounter: Secondary | ICD-10-CM | POA: Diagnosis not present

## 2018-11-15 DIAGNOSIS — S299XXA Unspecified injury of thorax, initial encounter: Secondary | ICD-10-CM | POA: Diagnosis not present

## 2018-11-15 DIAGNOSIS — Y999 Unspecified external cause status: Secondary | ICD-10-CM | POA: Diagnosis not present

## 2018-11-15 DIAGNOSIS — R58 Hemorrhage, not elsewhere classified: Secondary | ICD-10-CM | POA: Diagnosis not present

## 2018-11-15 DIAGNOSIS — M79641 Pain in right hand: Secondary | ICD-10-CM | POA: Diagnosis not present

## 2018-11-15 HISTORY — DX: Unspecified viral hepatitis C without hepatic coma: B19.20

## 2018-11-15 MED ORDER — HYDROMORPHONE HCL 1 MG/ML IJ SOLN
1.0000 mg | Freq: Once | INTRAMUSCULAR | Status: AC
  Administered 2018-11-15: 1 mg via INTRAVENOUS
  Filled 2018-11-15: qty 1

## 2018-11-15 MED ORDER — LIDOCAINE HCL URETHRAL/MUCOSAL 2 % EX GEL
1.0000 "application " | Freq: Once | CUTANEOUS | Status: AC
  Administered 2018-11-16: 1 via TOPICAL
  Filled 2018-11-15: qty 20

## 2018-11-15 MED ORDER — ACETAMINOPHEN 325 MG PO TABS
650.0000 mg | ORAL_TABLET | Freq: Once | ORAL | Status: AC
  Administered 2018-11-15: 650 mg via ORAL
  Filled 2018-11-15: qty 2
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
## 2018-11-15 NOTE — ED Notes (Signed)
Wife- Daine Floras, [phone] for updates (is very rude)
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
## 2018-11-15 NOTE — ED Notes (Signed)
Wife- Daine Floras, [phone] for updates

## 2018-11-15 NOTE — ED Notes (Signed)
ED Provider at bedside. 

## 2018-11-15 NOTE — ED Notes (Signed)
Patient transported to x-ray. ?

## 2018-11-16 DIAGNOSIS — S299XXA Unspecified injury of thorax, initial encounter: Secondary | ICD-10-CM | POA: Diagnosis not present

## 2018-11-16 DIAGNOSIS — S80211A Abrasion, right knee, initial encounter: Secondary | ICD-10-CM | POA: Diagnosis not present

## 2018-11-16 DIAGNOSIS — S6991XA Unspecified injury of right wrist, hand and finger(s), initial encounter: Secondary | ICD-10-CM | POA: Diagnosis not present

## 2018-11-16 DIAGNOSIS — M79641 Pain in right hand: Secondary | ICD-10-CM | POA: Diagnosis not present

## 2018-11-16 MED ORDER — HYDROCODONE-ACETAMINOPHEN 5-325 MG PO TABS
1.0000 | ORAL_TABLET | Freq: Once | ORAL | Status: AC
  Administered 2018-11-16: 1 via ORAL
  Filled 2018-11-16: qty 1

## 2018-11-16 MED ORDER — HYDROMORPHONE HCL 1 MG/ML IJ SOLN
1.0000 mg | Freq: Once | INTRAMUSCULAR | Status: AC
  Administered 2018-11-16: 1 mg via INTRAVENOUS
  Filled 2018-11-16: qty 1

## 2018-11-16 MED ORDER — CEPHALEXIN 500 MG PO CAPS: 500.0000 mg | ORAL_CAPSULE | Freq: Three times a day (TID) | ORAL | 0 refills | Status: AC

## 2018-11-16 MED ORDER — LIDOCAINE VISCOUS HCL 2 % MT SOLN
45.0000 mL | Freq: Once | OROMUCOSAL | Status: AC
  Administered 2018-11-16: 45 mL via OROMUCOSAL
  Filled 2018-11-16: qty 45

## 2018-11-16 MED ORDER — CEFTRIAXONE SODIUM 1 G IJ SOLR
1.0000 g | Freq: Once | INTRAMUSCULAR | Status: AC
  Administered 2018-11-16: 1 g via INTRAMUSCULAR
  Filled 2018-11-16: qty 10

## 2018-11-16 MED ORDER — LIDOCAINE HCL (PF) 1 % IJ SOLN
INTRAMUSCULAR | Status: AC
  Administered 2018-11-16: 2 mL
  Filled 2018-11-16: qty 5

## 2018-11-16 MED ORDER — ACETAMINOPHEN 500 MG PO TABS: 1000.0000 mg | ORAL_TABLET | Freq: Three times a day (TID) | ORAL | 0 refills | Status: AC

## 2018-11-16 NOTE — ED Notes (Signed)
Attempted to irrigate wound with saline, pt can only tolerate a little at a time due to the pain

## 2018-11-17 ENCOUNTER — Other Ambulatory Visit: Payer: Self-pay

## 2018-11-17 ENCOUNTER — Encounter (HOSPITAL_COMMUNITY): Payer: Self-pay

## 2018-11-17 ENCOUNTER — Observation Stay (HOSPITAL_COMMUNITY)
Admission: EM | Admit: 2018-11-17 | Discharge: 2018-11-18 | Discharge status: Against Medical Advice | Payer: BLUE CROSS/BLUE SHIELD | Attending: Internal Medicine | Admitting: Internal Medicine

## 2018-11-17 DIAGNOSIS — Z87442 Personal history of urinary calculi: Secondary | ICD-10-CM | POA: Insufficient documentation

## 2018-11-17 DIAGNOSIS — S80811A Abrasion, right lower leg, initial encounter: Secondary | ICD-10-CM | POA: Insufficient documentation

## 2018-11-17 DIAGNOSIS — Z0184 Encounter for antibody response examination: Secondary | ICD-10-CM | POA: Diagnosis not present

## 2018-11-17 DIAGNOSIS — Z1159 Encounter for screening for other viral diseases: Secondary | ICD-10-CM | POA: Diagnosis not present

## 2018-11-17 DIAGNOSIS — S40812A Abrasion of left upper arm, initial encounter: Secondary | ICD-10-CM | POA: Diagnosis not present

## 2018-11-17 DIAGNOSIS — Z8619 Personal history of other infectious and parasitic diseases: Secondary | ICD-10-CM | POA: Insufficient documentation

## 2018-11-17 DIAGNOSIS — S301XXA Contusion of abdominal wall, initial encounter: Secondary | ICD-10-CM | POA: Diagnosis not present

## 2018-11-17 DIAGNOSIS — S20311A Abrasion of right front wall of thorax, initial encounter: Secondary | ICD-10-CM | POA: Insufficient documentation

## 2018-11-17 DIAGNOSIS — S30810A Abrasion of lower back and pelvis, initial encounter: Secondary | ICD-10-CM | POA: Diagnosis not present

## 2018-11-17 DIAGNOSIS — N179 Acute kidney failure, unspecified: Secondary | ICD-10-CM | POA: Insufficient documentation

## 2018-11-17 DIAGNOSIS — S299XXA Unspecified injury of thorax, initial encounter: Secondary | ICD-10-CM | POA: Diagnosis not present

## 2018-11-17 DIAGNOSIS — M6282 Rhabdomyolysis: Secondary | ICD-10-CM | POA: Diagnosis not present

## 2018-11-17 DIAGNOSIS — S40811A Abrasion of right upper arm, initial encounter: Secondary | ICD-10-CM | POA: Insufficient documentation

## 2018-11-17 DIAGNOSIS — T07XXXA Unspecified multiple injuries, initial encounter: Secondary | ICD-10-CM | POA: Diagnosis not present

## 2018-11-17 DIAGNOSIS — Z79899 Other long term (current) drug therapy: Secondary | ICD-10-CM | POA: Diagnosis not present

## 2018-11-17 DIAGNOSIS — S20312A Abrasion of left front wall of thorax, initial encounter: Principal | ICD-10-CM | POA: Insufficient documentation

## 2018-11-17 DIAGNOSIS — S80812A Abrasion, left lower leg, initial encounter: Secondary | ICD-10-CM | POA: Diagnosis not present

## 2018-11-17 DIAGNOSIS — S20212A Contusion of left front wall of thorax, initial encounter: Secondary | ICD-10-CM | POA: Diagnosis not present

## 2018-11-17 DIAGNOSIS — R079 Chest pain, unspecified: Secondary | ICD-10-CM | POA: Diagnosis not present

## 2018-11-17 DIAGNOSIS — S300XXA Contusion of lower back and pelvis, initial encounter: Secondary | ICD-10-CM | POA: Diagnosis not present

## 2018-11-17 MED ORDER — LORAZEPAM 2 MG/ML IJ SOLN: 1.0000 mg | Freq: Once | INTRAMUSCULAR | Status: DC

## 2018-11-17 MED ORDER — LORAZEPAM 2 MG/ML IJ SOLN
1.0000 mg | Freq: Once | INTRAMUSCULAR | Status: AC
  Administered 2018-11-17: 1 mg via INTRAMUSCULAR
  Filled 2018-11-17: qty 1

## 2018-11-17 MED ORDER — OXYCODONE-ACETAMINOPHEN 5-325 MG PO TABS
1.0000 | ORAL_TABLET | ORAL | Status: DC | PRN
  Administered 2018-11-17: 1 via ORAL
  Filled 2018-11-17 (×2): qty 1
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
## 2018-11-18 ENCOUNTER — Emergency Department (HOSPITAL_COMMUNITY): Payer: BLUE CROSS/BLUE SHIELD

## 2018-11-18 DIAGNOSIS — S299XXA Unspecified injury of thorax, initial encounter: Secondary | ICD-10-CM | POA: Diagnosis not present

## 2018-11-18 DIAGNOSIS — T07XXXA Unspecified multiple injuries, initial encounter: Secondary | ICD-10-CM | POA: Diagnosis not present

## 2018-11-18 DIAGNOSIS — R079 Chest pain, unspecified: Secondary | ICD-10-CM | POA: Diagnosis not present

## 2018-11-18 LAB — CBC WITH DIFFERENTIAL/PLATELET
Abs Immature Granulocytes: 0.03 10*3/uL (ref 0.00–0.07)
Basophils Absolute: 0 10*3/uL (ref 0.0–0.1)
Basophils Relative: 0 %
Eosinophils Absolute: 0.2 10*3/uL (ref 0.0–0.5)
Eosinophils Relative: 2 %
HCT: 48.4 % (ref 39.0–52.0)
Hemoglobin: 16 g/dL (ref 13.0–17.0)
Immature Granulocytes: 0 %
Lymphocytes Relative: 19 %
Lymphs Abs: 1.7 10*3/uL (ref 0.7–4.0)
MCH: 30.5 pg (ref 26.0–34.0)
MCHC: 33.1 g/dL (ref 30.0–36.0)
MCV: 92.2 fL (ref 80.0–100.0)
Monocytes Absolute: 1 10*3/uL (ref 0.1–1.0)
Monocytes Relative: 11 %
Neutro Abs: 5.9 10*3/uL (ref 1.7–7.7)
Neutrophils Relative %: 68 %
Platelets: 181 10*3/uL (ref 150–400)
RBC: 5.25 MIL/uL (ref 4.22–5.81)
RDW: 13 % (ref 11.5–15.5)
WBC: 8.7 10*3/uL (ref 4.0–10.5)
nRBC: 0 % (ref 0.0–0.2)

## 2018-11-18 LAB — CK: Total CK: 789 U/L — ABNORMAL HIGH (ref 49–397)

## 2018-11-18 LAB — BASIC METABOLIC PANEL
Anion gap: 13 (ref 5–15)
BUN: 35 mg/dL — ABNORMAL HIGH (ref 6–20)
CO2: 20 mmol/L — ABNORMAL LOW (ref 22–32)
Calcium: 8.5 mg/dL — ABNORMAL LOW (ref 8.9–10.3)
Chloride: 101 mmol/L (ref 98–111)
Creatinine, Ser: 2.86 mg/dL — ABNORMAL HIGH (ref 0.61–1.24)
GFR calc Af Amer: 27 mL/min — ABNORMAL LOW (ref >60)
GFR calc non Af Amer: 23 mL/min — ABNORMAL LOW (ref >60)
Glucose, Bld: 115 mg/dL — ABNORMAL HIGH (ref 70–99)
Potassium: 3.4 mmol/L — ABNORMAL LOW (ref 3.5–5.1)
Sodium: 134 mmol/L — ABNORMAL LOW (ref 135–145)

## 2018-11-18 LAB — SARS CORONAVIRUS 2 BY RT PCR (HOSPITAL ORDER, PERFORMED IN ~~LOC~~ HOSPITAL LAB): SARS Coronavirus 2: NEGATIVE

## 2018-11-18 LAB — HIV ANTIBODY (ROUTINE TESTING W REFLEX): HIV Screen 4th Generation wRfx: NONREACTIVE

## 2018-11-18 MED ORDER — POLYETHYLENE GLYCOL 3350 17 G PO PACK: 17.0000 g | PACK | Freq: Every day | ORAL | Status: DC | PRN

## 2018-11-18 MED ORDER — ONDANSETRON HCL 4 MG/2ML IJ SOLN: 4.0000 mg | Freq: Four times a day (QID) | INTRAMUSCULAR | Status: DC | PRN

## 2018-11-18 MED ORDER — MUSCLE RUB 10-15 % EX CREA
1.0000 "application " | TOPICAL_CREAM | CUTANEOUS | Status: DC | PRN
  Filled 2018-11-18: qty 85

## 2018-11-18 MED ORDER — SENNOSIDES-DOCUSATE SODIUM 8.6-50 MG PO TABS: 2.0000 | ORAL_TABLET | Freq: Every evening | ORAL | Status: DC | PRN

## 2018-11-18 MED ORDER — HYDROMORPHONE HCL 1 MG/ML IJ SOLN
1.0000 mg | Freq: Once | INTRAMUSCULAR | Status: AC
  Administered 2018-11-18: 1 mg via INTRAMUSCULAR
  Filled 2018-11-18: qty 1

## 2018-11-18 MED ORDER — CEPHALEXIN 500 MG PO CAPS: 500.0000 mg | ORAL_CAPSULE | Freq: Three times a day (TID) | ORAL | Status: DC

## 2018-11-18 MED ORDER — SODIUM CHLORIDE 0.9 % IV SOLN: INTRAVENOUS | Status: DC

## 2018-11-18 MED ORDER — OXYCODONE HCL 5 MG PO TABS: 10.0000 mg | ORAL_TABLET | ORAL | Status: DC | PRN

## 2018-11-18 MED ORDER — ALUM & MAG HYDROXIDE-SIMETH 200-200-20 MG/5ML PO SUSP: 30.0000 mL | ORAL | Status: DC | PRN

## 2018-11-18 MED ORDER — LORATADINE 10 MG PO TABS: 10.0000 mg | ORAL_TABLET | Freq: Every day | ORAL | Status: DC | PRN

## 2018-11-18 MED ORDER — SALINE SPRAY 0.65 % NA SOLN
1.0000 | NASAL | Status: DC | PRN
  Filled 2018-11-18: qty 44

## 2018-11-18 MED ORDER — ONDANSETRON HCL 4 MG PO TABS: 4.0000 mg | ORAL_TABLET | Freq: Four times a day (QID) | ORAL | Status: DC | PRN

## 2018-11-18 MED ORDER — ACETAMINOPHEN 650 MG RE SUPP: 650.0000 mg | Freq: Four times a day (QID) | RECTAL | Status: DC | PRN

## 2018-11-18 MED ORDER — ENOXAPARIN SODIUM 40 MG/0.4ML ~~LOC~~ SOLN: 40.0000 mg | SUBCUTANEOUS | Status: DC

## 2018-11-18 MED ORDER — HYDRALAZINE HCL 20 MG/ML IJ SOLN: 10.0000 mg | INTRAMUSCULAR | Status: DC | PRN

## 2018-11-18 MED ORDER — ACETAMINOPHEN 325 MG PO TABS: 650.0000 mg | ORAL_TABLET | Freq: Four times a day (QID) | ORAL | Status: DC | PRN

## 2018-11-18 MED ORDER — LIP MEDEX EX OINT
1.0000 "application " | TOPICAL_OINTMENT | CUTANEOUS | Status: DC | PRN
  Filled 2018-11-18: qty 7

## 2018-11-18 MED ORDER — KETOROLAC TROMETHAMINE 30 MG/ML IJ SOLN
30.0000 mg | Freq: Four times a day (QID) | INTRAMUSCULAR | Status: DC | PRN
  Administered 2018-11-18 (×2): 30 mg via INTRAVENOUS
  Filled 2018-11-18 (×2): qty 1

## 2018-11-18 MED ORDER — HYDROMORPHONE HCL 1 MG/ML IJ SOLN
1.0000 mg | INTRAMUSCULAR | Status: DC | PRN
  Administered 2018-11-18 (×2): 1 mg via INTRAVENOUS
  Filled 2018-11-18 (×2): qty 1

## 2018-11-18 MED ORDER — HYDROMORPHONE HCL 2 MG/ML IJ SOLN: 2.0000 mg | INTRAMUSCULAR | Status: DC | PRN

## 2018-11-18 MED ORDER — POLYVINYL ALCOHOL 1.4 % OP SOLN
1.0000 [drp] | OPHTHALMIC | Status: DC | PRN
  Filled 2018-11-18: qty 15

## 2018-11-18 MED ORDER — OXYCODONE HCL 5 MG PO TABS
5.0000 mg | ORAL_TABLET | ORAL | Status: DC | PRN
  Administered 2018-11-18 (×2): 5 mg via ORAL
  Filled 2018-11-18 (×2): qty 1

## 2018-11-18 MED ORDER — SODIUM CHLORIDE 0.9 % IV BOLUS
1000.0000 mL | Freq: Once | INTRAVENOUS | Status: AC
  Administered 2018-11-18: 1000 mL via INTRAVENOUS

## 2018-11-18 MED ORDER — IPRATROPIUM-ALBUTEROL 0.5-2.5 (3) MG/3ML IN SOLN: 3.0000 mL | RESPIRATORY_TRACT | Status: DC | PRN

## 2018-11-18 MED ORDER — PHENOL 1.4 % MT LIQD: 1.0000 | OROMUCOSAL | Status: DC | PRN

## 2018-11-18 MED ORDER — HYDROCORTISONE 1 % EX CREA
1.0000 "application " | TOPICAL_CREAM | Freq: Three times a day (TID) | CUTANEOUS | Status: DC | PRN
  Filled 2018-11-18: qty 28

## 2018-11-18 MED ORDER — KETOROLAC TROMETHAMINE 15 MG/ML IJ SOLN
15.0000 mg | Freq: Once | INTRAMUSCULAR | Status: AC
  Administered 2018-11-18: 15 mg via INTRAVENOUS
  Filled 2018-11-18: qty 1

## 2018-11-18 MED ORDER — HYDROCORTISONE (PERIANAL) 2.5 % EX CREA
1.0000 "application " | TOPICAL_CREAM | Freq: Four times a day (QID) | CUTANEOUS | Status: DC | PRN
  Filled 2018-11-18: qty 28.35
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
## 2018-11-18 NOTE — Progress Notes (Signed)
Patient wanting to leave AMA because he thinks his pain isnt controlled. I called him to speak with him to discuss his pain but in middle of the conversation he hung up on me. He will answer again. Vitals signs are stable. I tried calling his wife, but she did not answer. Her voicemail mailbox if full.  I will increase his pain medication to Diluadid 2mg  q4hrs prn and Oxycodone IR 10mg  q4hrs prn. This can be titrated as needed.   Otherwise patient has been alert awake oriented. No harm to self and others.   High risk for leaving AMA.  Discussed with RN.   Gerlean Ren MD
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
## 2018-11-18 NOTE — Plan of Care (Signed)
  Problem: Education: Goal: Knowledge of General Education information will improve Description: Including pain rating scale, medication(s)/side effects and non-pharmacologic comfort measures Outcome: Progressing   Problem: Clinical Measurements: Goal: Will remain free from infection Outcome: Progressing   Problem: Activity: Goal: Risk for activity intolerance will decrease Outcome: Progressing   Problem: Coping: Goal: Level of anxiety will decrease Outcome: Progressing   Problem: Pain Managment: Goal: General experience of comfort will improve Outcome: Progressing   Problem: Skin Integrity: Goal: Risk for impaired skin integrity will decrease Outcome: Progressing   

## 2018-11-18 NOTE — ED Notes (Signed)
Patient transported to CT 

## 2018-11-18 NOTE — Progress Notes (Addendum)
Pt requested to leave AMA. Notified charge RN and provider. Provider called pt's room and spoke to pt. Pt stated to charge RN that he wants to leave because he feels "trapped," and that it has nothing to do with pain. Pt said he is leaving AMA, and that he has a ride from the hospital. Removed IV and had pt sign AMA papers.

## 2018-11-18 NOTE — ED Notes (Signed)
Admitting at bedside 
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
## 2018-11-18 NOTE — Plan of Care (Signed)
  Problem: Education: Goal: Knowledge of General Education information will improve Description Including pain rating scale, medication(s)/side effects and non-pharmacologic comfort measures Outcome: Progressing   

## 2018-11-20 DIAGNOSIS — S50811A Abrasion of right forearm, initial encounter: Secondary | ICD-10-CM | POA: Diagnosis not present

## 2018-11-20 DIAGNOSIS — T148XXA Other injury of unspecified body region, initial encounter: Secondary | ICD-10-CM | POA: Diagnosis not present

## 2018-11-20 DIAGNOSIS — S30811A Abrasion of abdominal wall, initial encounter: Secondary | ICD-10-CM | POA: Diagnosis not present

## 2018-11-20 DIAGNOSIS — S40812A Abrasion of left upper arm, initial encounter: Secondary | ICD-10-CM | POA: Diagnosis not present

## 2018-11-20 DIAGNOSIS — Z043 Encounter for examination and observation following other accident: Secondary | ICD-10-CM | POA: Diagnosis not present

## 2018-11-20 DIAGNOSIS — S20312A Abrasion of left front wall of thorax, initial encounter: Secondary | ICD-10-CM | POA: Diagnosis not present

## 2018-11-20 DIAGNOSIS — S50812A Abrasion of left forearm, initial encounter: Secondary | ICD-10-CM | POA: Diagnosis not present

## 2018-11-20 DIAGNOSIS — M79601 Pain in right arm: Secondary | ICD-10-CM | POA: Diagnosis not present

## 2018-11-20 DIAGNOSIS — M25561 Pain in right knee: Secondary | ICD-10-CM | POA: Diagnosis not present

## 2018-11-20 DIAGNOSIS — S50311A Abrasion of right elbow, initial encounter: Secondary | ICD-10-CM | POA: Diagnosis not present

## 2018-11-20 DIAGNOSIS — S40811A Abrasion of right upper arm, initial encounter: Secondary | ICD-10-CM | POA: Diagnosis not present

## 2018-11-20 DIAGNOSIS — S80211A Abrasion, right knee, initial encounter: Secondary | ICD-10-CM | POA: Diagnosis not present
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
## 2018-11-21 DIAGNOSIS — Z043 Encounter for examination and observation following other accident: Secondary | ICD-10-CM | POA: Diagnosis not present

## 2018-11-27 DIAGNOSIS — R110 Nausea: Secondary | ICD-10-CM | POA: Diagnosis not present

## 2018-11-27 DIAGNOSIS — S39012A Strain of muscle, fascia and tendon of lower back, initial encounter: Secondary | ICD-10-CM | POA: Diagnosis not present

## 2018-12-05 DIAGNOSIS — M5126 Other intervertebral disc displacement, lumbar region: Secondary | ICD-10-CM | POA: Diagnosis not present

## 2018-12-05 DIAGNOSIS — R238 Other skin changes: Secondary | ICD-10-CM | POA: Diagnosis not present

## 2018-12-05 DIAGNOSIS — S39012D Strain of muscle, fascia and tendon of lower back, subsequent encounter: Secondary | ICD-10-CM | POA: Diagnosis not present

## 2018-12-18 ENCOUNTER — Encounter (HOSPITAL_COMMUNITY): Payer: Self-pay | Admitting: *Deleted

## 2018-12-18 ENCOUNTER — Emergency Department (HOSPITAL_COMMUNITY)
Admission: EM | Admit: 2018-12-18 | Discharge: 2018-12-18 | Disposition: A | Discharge status: Home / Self Care | Payer: BLUE CROSS/BLUE SHIELD | Attending: Emergency Medicine | Admitting: Emergency Medicine

## 2018-12-18 ENCOUNTER — Other Ambulatory Visit: Payer: Self-pay

## 2018-12-18 DIAGNOSIS — R21 Rash and other nonspecific skin eruption: Secondary | ICD-10-CM

## 2018-12-18 DIAGNOSIS — L299 Pruritus, unspecified: Secondary | ICD-10-CM | POA: Diagnosis not present

## 2018-12-18 MED ORDER — DIPHENHYDRAMINE HCL 25 MG PO CAPS
25.0000 mg | ORAL_CAPSULE | Freq: Once | ORAL | Status: AC
  Administered 2018-12-18: 25 mg via ORAL
  Filled 2018-12-18: qty 1

## 2018-12-18 NOTE — Discharge Instructions (Addendum)
Please read attached information. If you experience any new or worsening signs or symptoms please return to the emergency room for evaluation.  I would recommend calling an exterminator to assess your home for any bedbugs.  Please follow-up with your primary care provider or specialist as discussed.  Please use Benadryl as needed for itching.
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
## 2018-12-18 NOTE — ED Provider Notes (Signed)
[place] EMERGENCY DEPARTMENT Provider Note   CSN: [id number] Arrival date & time: 12/18/18  0425     History   Chief Complaint Chief Complaint  Patient presents with  . Rash    HPI Randall York is a 58 y.o. male.     HPI    58 year old male presents today with complaints of bug bites.  Patient notes that he woke up this morning with wounds to his upper extremities lower extremities and chest.  He notes that bugs are coming out of him.  He believes these are bedbugs and has got them out.  He notes he can squeeze them out.  He notes 4 months ago he stayed at someone's house and believe this is worse from.  He notes no bedbugs in his house.  He denies any other complaints.  No medications prior to arrival.  Past Medical History:  Diagnosis Date  . Hepatitis C   . Kidney stone     Patient Active Problem List   Diagnosis Date Noted  . Abrasions of multiple sites 11/18/2018    Past Surgical History:  Procedure Laterality Date  . ABDOMINAL SURGERY    . ANTERIOR CRUCIATE LIGAMENT REPAIR    . APPENDECTOMY    . CYSTOSCOPY WITH RETROGRADE PYELOGRAM, URETEROSCOPY AND STENT PLACEMENT Right 12/08/2017   Procedure: CYSTOSCOPY WITH RIGHT RETROGRADE URETEROSCOPY AND STENT PLACEMENT;  Surgeon: Irine Seal, MD;  Location: [facility];  Service: Urology;  Laterality: Right;  . EYE SURGERY    . TONSILLECTOMY          Home Medications    Prior to Admission medications   Not on File    Family History Family History  Problem Relation Age of Onset  . Kidney Stones Mother     Social History Social History   Tobacco Use  . Smoking status: Never Smoker  . Smokeless tobacco: Never Used  Substance Use Topics  . Alcohol use: Yes  . Drug use: Yes    Types: Cocaine     Allergies   Patient has no known allergies.   Review of Systems Review of Systems  All other systems reviewed and are negative.    Physical Exam Updated  Vital Signs BP 134/67   Pulse 90   Temp 98 F (36.7 C)   Resp 18   SpO2 98%   Physical Exam Vitals signs and nursing note reviewed.  Constitutional:      Appearance: He is well-developed.     Comments: Restless  HENT:     Head: Normocephalic and atraumatic.  Eyes:     General: No scleral icterus.       Right eye: No discharge.        Left eye: No discharge.     Conjunctiva/sclera: Conjunctivae normal.     Pupils: Pupils are equal, round, and reactive to light.  Neck:     Musculoskeletal: Normal range of motion.     Vascular: No JVD.     Trachea: No tracheal deviation.  Pulmonary:     Effort: Pulmonary effort is normal.     Breath sounds: No stridor.  Skin:    Comments: Several areas with minor wounds to his chest upper extremities and lower extremities, no significant surrounding erythema, no discharge  Neurological:     Mental Status: He is alert and oriented to person, place, and time.     Coordination: Coordination normal.  Psychiatric:        Behavior:  Behavior normal.        Thought Content: Thought content normal.        Judgment: Judgment normal.      ED Treatments / Results  Labs (all labs ordered are listed, but only abnormal results are displayed) Labs Reviewed - No data to display  EKG None  Radiology No results found.  Procedures Procedures (including critical care time)  Medications Ordered in ED Medications  diphenhydrAMINE (BENADRYL) capsule 25 mg (25 mg Oral Given 12/18/18 [id number])     Initial Impression / Assessment and Plan / ED Course  I have reviewed the triage vital signs and the nursing notes.  Pertinent labs & imaging results that were available during my care of the patient were reviewed by me and considered in my medical decision making (see chart for details).        58 year old male presents today with what appears to be self-inflicted wounds.  Patient is actively scratching and picking at his skin, he is picking at areas  that have not formed wounds yet.  He has no significant involvement of the areas that he cannot reach.  Although this could likely be areas of bedbugs causing itching and pain, question drug-induced behavior given his restlessness presently.  He has no signs of significant cellulitis.  He will use Neosporin on the areas that are sore, Benadryl as needed for itching return immediately if he develops any new or worsening signs or symptoms.  Verbalized understanding and agreement to this plan.  Final Clinical Impressions(s) / ED Diagnoses   Final diagnoses:  Rash    ED Discharge Orders    None       Rosalio LoudHedges, Veleria Barnhardt, PA-C 12/18/18 [id number]    Tilden Fossaees, Elizabeth, MD 12/19/18 [phone]

## 2019-01-14 DIAGNOSIS — M546 Pain in thoracic spine: Secondary | ICD-10-CM | POA: Diagnosis not present

## 2019-01-14 DIAGNOSIS — K7689 Other specified diseases of liver: Secondary | ICD-10-CM | POA: Diagnosis not present

## 2019-01-14 DIAGNOSIS — R110 Nausea: Secondary | ICD-10-CM | POA: Diagnosis not present

## 2019-01-14 DIAGNOSIS — M5441 Lumbago with sciatica, right side: Secondary | ICD-10-CM | POA: Diagnosis not present

## 2019-01-14 DIAGNOSIS — R109 Unspecified abdominal pain: Secondary | ICD-10-CM | POA: Diagnosis not present

## 2019-01-29 ENCOUNTER — Other Ambulatory Visit: Payer: Self-pay

## 2019-01-29 ENCOUNTER — Encounter: Payer: Self-pay | Admitting: Physical Therapy

## 2019-01-29 ENCOUNTER — Ambulatory Visit: Payer: BC Managed Care – PPO | Attending: Internal Medicine | Admitting: Physical Therapy

## 2019-01-29 DIAGNOSIS — M545 Low back pain, unspecified: Secondary | ICD-10-CM

## 2019-01-29 DIAGNOSIS — G8929 Other chronic pain: Secondary | ICD-10-CM | POA: Insufficient documentation

## 2019-01-29 DIAGNOSIS — M6281 Muscle weakness (generalized): Secondary | ICD-10-CM

## 2019-01-29 DIAGNOSIS — M79604 Pain in right leg: Secondary | ICD-10-CM | POA: Insufficient documentation

## 2019-01-29 DIAGNOSIS — R262 Difficulty in walking, not elsewhere classified: Secondary | ICD-10-CM | POA: Insufficient documentation
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
## 2019-02-05 ENCOUNTER — Ambulatory Visit: Payer: BC Managed Care – PPO | Admitting: Physical Therapy

## 2019-02-14 ENCOUNTER — Other Ambulatory Visit: Payer: Self-pay

## 2019-02-14 ENCOUNTER — Encounter (HOSPITAL_COMMUNITY): Payer: Self-pay | Admitting: Emergency Medicine

## 2019-02-14 DIAGNOSIS — M545 Low back pain: Secondary | ICD-10-CM | POA: Diagnosis not present

## 2019-02-14 DIAGNOSIS — M549 Dorsalgia, unspecified: Secondary | ICD-10-CM | POA: Diagnosis not present

## 2019-02-14 DIAGNOSIS — F17290 Nicotine dependence, other tobacco product, uncomplicated: Secondary | ICD-10-CM | POA: Insufficient documentation

## 2019-02-14 DIAGNOSIS — M5416 Radiculopathy, lumbar region: Secondary | ICD-10-CM | POA: Insufficient documentation

## 2019-02-14 DIAGNOSIS — M25561 Pain in right knee: Secondary | ICD-10-CM | POA: Diagnosis not present

## 2019-02-14 DIAGNOSIS — M1711 Unilateral primary osteoarthritis, right knee: Secondary | ICD-10-CM | POA: Diagnosis not present

## 2019-02-14 DIAGNOSIS — M5116 Intervertebral disc disorders with radiculopathy, lumbar region: Secondary | ICD-10-CM | POA: Diagnosis not present

## 2019-02-14 DIAGNOSIS — M5126 Other intervertebral disc displacement, lumbar region: Secondary | ICD-10-CM | POA: Insufficient documentation
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
## 2019-02-15 ENCOUNTER — Emergency Department (HOSPITAL_COMMUNITY)
Admission: EM | Admit: 2019-02-15 | Discharge: 2019-02-15 | Disposition: A | Discharge status: Home / Self Care | Payer: Self-pay | Attending: Emergency Medicine | Admitting: Emergency Medicine

## 2019-02-15 ENCOUNTER — Emergency Department (HOSPITAL_COMMUNITY): Payer: Self-pay

## 2019-02-15 DIAGNOSIS — M545 Low back pain, unspecified: Secondary | ICD-10-CM

## 2019-02-15 DIAGNOSIS — M5126 Other intervertebral disc displacement, lumbar region: Secondary | ICD-10-CM

## 2019-02-15 DIAGNOSIS — M5416 Radiculopathy, lumbar region: Secondary | ICD-10-CM

## 2019-02-15 DIAGNOSIS — M25561 Pain in right knee: Secondary | ICD-10-CM | POA: Diagnosis not present

## 2019-02-15 DIAGNOSIS — M1711 Unilateral primary osteoarthritis, right knee: Secondary | ICD-10-CM | POA: Diagnosis not present

## 2019-02-15 DIAGNOSIS — M549 Dorsalgia, unspecified: Secondary | ICD-10-CM | POA: Diagnosis not present

## 2019-02-15 MED ORDER — ONDANSETRON 4 MG PO TBDP
4.0000 mg | ORAL_TABLET | Freq: Once | ORAL | Status: AC
  Administered 2019-02-15: 10:00:00 4 mg via ORAL
  Filled 2019-02-15: qty 1

## 2019-02-15 MED ORDER — METHYLPREDNISOLONE 4 MG PO TBPK: ORAL_TABLET | ORAL | 0 refills | Status: AC

## 2019-02-15 MED ORDER — DIAZEPAM 5 MG PO TABS
10.0000 mg | ORAL_TABLET | Freq: Once | ORAL | Status: AC
  Administered 2019-02-15: 08:00:00 10 mg via ORAL
  Filled 2019-02-15: qty 2

## 2019-02-15 MED ORDER — DEXAMETHASONE SODIUM PHOSPHATE 10 MG/ML IJ SOLN
10.0000 mg | Freq: Once | INTRAMUSCULAR | Status: AC
  Administered 2019-02-15: 08:00:00 10 mg via INTRAMUSCULAR
  Filled 2019-02-15: qty 1

## 2019-02-15 MED ORDER — KETOROLAC TROMETHAMINE 30 MG/ML IJ SOLN
30.0000 mg | Freq: Once | INTRAMUSCULAR | Status: AC
  Administered 2019-02-15: 04:00:00 30 mg via INTRAMUSCULAR
  Filled 2019-02-15: qty 1

## 2019-02-15 MED ORDER — ACETAMINOPHEN 500 MG PO TABS: 1000.0000 mg | ORAL_TABLET | Freq: Once | ORAL | Status: DC

## 2019-02-15 NOTE — ED Provider Notes (Signed)
Patient CARE signed out to follow-up MRI results.  Patient's had acute on chronic right lower back pain with worsening muscle wasting in the right thigh.  Patient denies any other focal neuro weakness.  On exam patient has normal strength with flexion dorsiflexion bilateral ankles/feet, knees and hips.  Patient having worsening pain after MRI and reassessment.  Instructed nursing to give medicines that were ordered.  MRI reviewed showed right L3-L4 disc herniation.  Paged neurosurgery to clarify follow-up details.  Discussed MRI results with neurosurgery and likely plan for surgery on Tuesday with Dr. Arnoldo Morale.  Patient is to follow-up closely outpatient.  Steroid prescription given.  Golda Acre, MD 02/15/19 [phone]

## 2019-02-15 NOTE — Discharge Instructions (Signed)
Use your home pain medicines as needed. Take steroids as directed for the next 6 days. Neurosurgery will follow up with you to schedule surgery which will likely be Tuesday.
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
## 2019-05-31 ENCOUNTER — Emergency Department (HOSPITAL_COMMUNITY)
Admission: EM | Admit: 2019-05-31 | Discharge: 2019-06-01 | Disposition: A | Discharge status: Home / Self Care | Payer: Self-pay | Attending: Emergency Medicine | Admitting: Emergency Medicine

## 2019-05-31 ENCOUNTER — Encounter (HOSPITAL_COMMUNITY): Payer: Self-pay

## 2019-05-31 DIAGNOSIS — R21 Rash and other nonspecific skin eruption: Secondary | ICD-10-CM | POA: Insufficient documentation

## 2019-05-31 DIAGNOSIS — Z8619 Personal history of other infectious and parasitic diseases: Secondary | ICD-10-CM | POA: Insufficient documentation

## 2019-05-31 DIAGNOSIS — Z720 Tobacco use: Secondary | ICD-10-CM | POA: Insufficient documentation

## 2019-05-31 NOTE — ED Notes (Signed)
McDonald PA at bedside.
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
## 2019-05-31 NOTE — ED Triage Notes (Signed)
Pt arrives POV for eval of skin rash. He reports that he feels that bugs are crawling out of his skin under the area of the rash. States PCP eval'd and told him it was not bedbugs, but he threw all his belongings out and moved apartments to ensure that was not the case. States the rash is persistent, has many pictures on his phone of scabs he has picked and states the scabs have "little yellow eyes on them".

## 2019-06-01 ENCOUNTER — Telehealth: Payer: Self-pay | Admitting: *Deleted

## 2019-06-01 LAB — COMPREHENSIVE METABOLIC PANEL
ALT: 130 U/L — ABNORMAL HIGH (ref 0–44)
AST: 130 U/L — ABNORMAL HIGH (ref 15–41)
Albumin: 4.1 g/dL (ref 3.5–5.0)
Alkaline Phosphatase: 51 U/L (ref 38–126)
Anion gap: 11 (ref 5–15)
BUN: 11 mg/dL (ref 6–20)
CO2: 25 mmol/L (ref 22–32)
Calcium: 9.1 mg/dL (ref 8.9–10.3)
Chloride: 102 mmol/L (ref 98–111)
Creatinine, Ser: 0.88 mg/dL (ref 0.61–1.24)
GFR calc Af Amer: >60 mL/min (ref >60)
GFR calc non Af Amer: >60 mL/min (ref >60)
Glucose, Bld: 99 mg/dL (ref 70–99)
Potassium: 3.9 mmol/L (ref 3.5–5.1)
Sodium: 138 mmol/L (ref 135–145)
Total Bilirubin: 0.9 mg/dL (ref 0.3–1.2)
Total Protein: 7.5 g/dL (ref 6.5–8.1)

## 2019-06-01 LAB — CBC WITH DIFFERENTIAL/PLATELET
Abs Immature Granulocytes: 0.02 10*3/uL (ref 0.00–0.07)
Basophils Absolute: 0 10*3/uL (ref 0.0–0.1)
Basophils Relative: 1 %
Eosinophils Absolute: 0.4 10*3/uL (ref 0.0–0.5)
Eosinophils Relative: 6 %
HCT: 48.1 % (ref 39.0–52.0)
Hemoglobin: 16 g/dL (ref 13.0–17.0)
Immature Granulocytes: 0 %
Lymphocytes Relative: 42 %
Lymphs Abs: 2.7 10*3/uL (ref 0.7–4.0)
MCH: 29.9 pg (ref 26.0–34.0)
MCHC: 33.3 g/dL (ref 30.0–36.0)
MCV: 89.9 fL (ref 80.0–100.0)
Monocytes Absolute: 0.7 10*3/uL (ref 0.1–1.0)
Monocytes Relative: 11 %
Neutro Abs: 2.6 10*3/uL (ref 1.7–7.7)
Neutrophils Relative %: 40 %
Platelets: 231 10*3/uL (ref 150–400)
RBC: 5.35 MIL/uL (ref 4.22–5.81)
RDW: 12.8 % (ref 11.5–15.5)
WBC: 6.4 10*3/uL (ref 4.0–10.5)
nRBC: 0 % (ref 0.0–0.2)

## 2019-06-01 LAB — HEPATITIS PANEL, ACUTE
HCV Ab: REACTIVE — AB
Hep A IgM: NONREACTIVE
Hep B C IgM: NONREACTIVE
Hepatitis B Surface Ag: NONREACTIVE

## 2019-06-01 LAB — PROTIME-INR
INR: 1 (ref 0.8–1.2)
Prothrombin Time: 13.1 seconds (ref 11.4–15.2)

## 2019-06-01 MED ORDER — TRIAMCINOLONE ACETONIDE 0.1 % EX CREA: 1.0000 "application " | TOPICAL_CREAM | Freq: Two times a day (BID) | CUTANEOUS | 0 refills | Status: AC

## 2019-06-01 NOTE — Telephone Encounter (Signed)
Patient called to schedule appointment per ER recommendations. Scheduled for Monday 06/04/19 at 11:00. Patient accepted. Andree Coss, RN

## 2019-06-01 NOTE — ED Notes (Signed)
Blood sent to lab

## 2019-06-01 NOTE — Discharge Instructions (Addendum)
Thank you for allowing me to care for you today in the Emergency Department.   You were seen today for a rash and the rectal bleeding.  I suspect this is all related to your previous diagnosis of hepatitis C.   Call the infectious disease clinic to schedule a follow-up appointment to discuss her options for hepatitis C treatment. I am also going to send them a message in the electronic medical record to let them know that you will be calling for follow-up.  You can apply a thin layer of the triamcinolone cream to the area on your legs, abdomen, or back for itching up to 2 times daily.  You should return to the emergency department if you develop spontaneous bleeding, such as bleeding from your gums, severe bruising on your legs, blood in your urine, if you have heavy amounts of rectal bleeding and you get dizzy or pass out, or if you develop other new, concerning symptoms.

## 2019-06-01 NOTE — Telephone Encounter (Signed)
-----   Message from Ginnie Smart, MD sent at 06/01/2019 10:36 AM EST ----- Regarding: RE: ID Follow Up Thanks for the f/u Will work on getting him into our clinic Thanks! jeff ----- Message ----- From: Barkley Boards, PA-C Sent: 06/01/2019   8:00 AM EST To: Ginnie Smart, MD Subject: ID Follow Up                                   Hi Dr. Ninetta Lights,  I had the pleasure of taking care of Randall York in the ER last night. He was diagnosed with hepatitis C more than 20 years. His initial labs were not available in our system so I repeated a hepatitis panel last night. I am concerned his symptoms that brought him to the ER last night are related to an acute flare of his chronic hepatitis C. I referred him to the ID clinic, but wanted to close the loop to ensure he doesn't get lost to follow up.  Thank you for your time,  Frederik Pear, PA-C

## 2019-06-01 NOTE — ED Notes (Signed)
Pt was discharged from the ED. Pt read and understood discharge paperwork. E-signature unavailable. Pt had vital signs completed. Pt conscious, breathing, and A&Ox4. No distress noted. Pt speaking in complete sentences. Pt ambulated out of the ED with a smooth and steady gait.

## 2019-06-04 ENCOUNTER — Other Ambulatory Visit: Payer: Self-pay

## 2019-06-04 ENCOUNTER — Encounter: Payer: Self-pay | Admitting: Internal Medicine

## 2019-06-04 ENCOUNTER — Ambulatory Visit (INDEPENDENT_AMBULATORY_CARE_PROVIDER_SITE_OTHER): Payer: Self-pay | Admitting: Internal Medicine

## 2019-06-04 DIAGNOSIS — B182 Chronic viral hepatitis C: Secondary | ICD-10-CM | POA: Insufficient documentation

## 2019-06-04 NOTE — Progress Notes (Signed)
[facility]      Reason for Consult: chronic hepatitis C    Referring Physician: Dr. Eudelia Bunch    Patient ID: Randall York, male    DOB: 20-Jan-1961, 59 y.o.   MRN: [id number]  HPI:   Here for evaluation of hepatitis C.  He has known about having hepatitis C for years but never sought treatment.  He was in jail for many years and never offered treatment.  He was seen in the ED for his rash and noted transaminitis.  Previous labs noted as well and with some previous liver inflammation.  He is concerned about his rash as well.  He says it hurts, does not itch.  He is interested int reatment.  Previous record reviewed in [facility] and did have more mild transaminitis last year.    Past Medical History:  Diagnosis Date  . Hepatitis C   . Kidney stone     Prior to Admission medications   Medication Sig Start Date End Date Taking? Authorizing Provider  cyclobenzaprine (FEXMID) 7.5 MG tablet Take 7.5 mg by mouth 3 (three) times daily as needed for muscle spasms.   Yes [provider]  ibuprofen (ADVIL) 800 MG tablet Take 800 mg by mouth 3 (three) times daily as needed. 05/01/19  Yes [provider]  methylPREDNISolone (MEDROL DOSEPAK) 4 MG TBPK tablet Medrol dose pack taper 02/15/19  Yes Blane Ohara, MD  oxyCODONE-acetaminophen (PERCOCET) 7.5-325 MG tablet Take 1 tablet by mouth every 8 (eight) hours as needed. 05/16/19  Yes [provider]  triamcinolone cream (KENALOG) 0.1 % Apply 1 application topically 2 (two) times daily. 06/01/19  Yes McDonald, Mia A, PA-C    No Known Allergies  Social History   Tobacco Use  . Smoking status: Current Some Day Smoker    Types: Cigars  . Smokeless tobacco: Never Used  Substance Use Topics  . Alcohol use: Yes  . Drug use: Yes    Types: Cocaine    Family History  Problem Relation Age of Onset  . Kidney Stones Mother     Review of Systems  Constitutional: negative for fevers,  chills and malaise Gastrointestinal: negative for nausea and diarrhea Musculoskeletal: negative for myalgias and arthralgias All other systems reviewed and are negative    Constitutional: in no apparent distress There were no vitals filed for this visit. EYES: anicteric ENMT: no thrush Respiratory: no respiratory distress GI: soft, nt Musculoskeletal: no pedal edema noted Skin: negatives: multiple skin lesions in varying states of healing.  They are circular, erythematous and areas of dry, scaly erythema. Neuro: non-focal  Labs: Lab Results  Component Value Date   WBC 6.4 05/31/2019   HGB 16.0 05/31/2019   HCT 48.1 05/31/2019   MCV 89.9 05/31/2019   PLT 231 05/31/2019    Lab Results  Component Value Date   CREATININE 0.88 05/31/2019   BUN 11 05/31/2019   NA 138 05/31/2019   K 3.9 05/31/2019   CL 102 05/31/2019   CO2 25 05/31/2019    Lab Results  Component Value Date   ALT 130 (H) 05/31/2019   AST 130 (H) 05/31/2019   ALKPHOS 51 05/31/2019   BILITOT 0.9 05/31/2019   INR 1.0 05/31/2019     Assessment:  Likely chronic hepatitis C, never treated.  I discussed the benefits of treatment and outcomes, depending on liver fibrosis.  I discussed treatment options including one pill a day options.    Plan: 1) labs  2) ultrasound of liver Follow up in 2 weeks to discuss treatment options.

## 2019-06-08 ENCOUNTER — Ambulatory Visit (HOSPITAL_COMMUNITY): Payer: Self-pay

## 2019-06-08 ENCOUNTER — Other Ambulatory Visit: Payer: Self-pay

## 2019-06-08 ENCOUNTER — Ambulatory Visit (HOSPITAL_COMMUNITY)
Admission: RE | Admit: 2019-06-08 | Discharge: 2019-06-08 | Disposition: A | Discharge status: Home / Self Care | Payer: Self-pay | Source: Ambulatory Visit | Attending: Internal Medicine | Admitting: Internal Medicine

## 2019-06-08 DIAGNOSIS — B182 Chronic viral hepatitis C: Secondary | ICD-10-CM | POA: Insufficient documentation

## 2019-06-08 LAB — CBC WITH DIFFERENTIAL/PLATELET
Absolute Monocytes: 658 cells/uL (ref 200–950)
Basophils Absolute: 49 cells/uL (ref 0–200)
Basophils Relative: 0.7 %
Eosinophils Absolute: 189 cells/uL (ref 15–500)
Eosinophils Relative: 2.7 %
HCT: 45.2 % (ref 38.5–50.0)
Hemoglobin: 15.6 g/dL (ref 13.2–17.1)
Lymphs Abs: 2324 cells/uL (ref 850–3900)
MCH: 30.7 pg (ref 27.0–33.0)
MCHC: 34.5 g/dL (ref 32.0–36.0)
MCV: 89 fL (ref 80.0–100.0)
MPV: 11.8 fL (ref 7.5–12.5)
Monocytes Relative: 9.4 %
Neutro Abs: 3780 cells/uL (ref 1500–7800)
Neutrophils Relative %: 54 %
Platelets: 224 10*3/uL (ref 140–400)
RBC: 5.08 10*6/uL (ref 4.20–5.80)
RDW: 12.5 % (ref 11.0–15.0)
Total Lymphocyte: 33.2 %
WBC: 7 10*3/uL (ref 3.8–10.8)

## 2019-06-08 LAB — HEPATITIS B SURFACE ANTIBODY,QUALITATIVE: Hep B S Ab: NONREACTIVE

## 2019-06-08 LAB — LIVER FIBROSIS, FIBROTEST-ACTITEST
ALT: 81 U/L — ABNORMAL HIGH (ref 9–46)
Alpha-2-Macroglobulin: 399 mg/dL — ABNORMAL HIGH (ref 106–279)
Apolipoprotein A1: 145 mg/dL (ref 94–176)
Bilirubin: 0.7 mg/dL (ref 0.2–1.2)
Fibrosis Score: 0.67
GGT: 54 U/L (ref 3–85)
Haptoglobin: 175 mg/dL (ref 43–212)
Necroinflammat ACT Score: 0.62
Reference ID: 3265546

## 2019-06-08 LAB — HEPATITIS A ANTIBODY, TOTAL: Hepatitis A AB,Total: REACTIVE — AB

## 2019-06-08 LAB — HEPATITIS C RNA QUANTITATIVE
HCV Quantitative Log: 6.48 Log IU/mL — ABNORMAL HIGH
HCV RNA, PCR, QN: 3030000 IU/mL — ABNORMAL HIGH

## 2019-06-08 LAB — HEPATITIS B CORE ANTIBODY, TOTAL: Hep B Core Total Ab: NONREACTIVE

## 2019-06-08 LAB — HEPATITIS C GENOTYPE

## 2019-06-08 LAB — HIV ANTIBODY (ROUTINE TESTING W REFLEX): HIV 1&2 Ab, 4th Generation: NONREACTIVE

## 2019-06-18 ENCOUNTER — Telehealth: Payer: Self-pay | Admitting: Pharmacy Technician

## 2019-06-18 ENCOUNTER — Ambulatory Visit (INDEPENDENT_AMBULATORY_CARE_PROVIDER_SITE_OTHER): Payer: Self-pay | Admitting: Internal Medicine

## 2019-06-18 ENCOUNTER — Other Ambulatory Visit: Payer: Self-pay

## 2019-06-18 ENCOUNTER — Encounter: Payer: Self-pay | Admitting: Internal Medicine

## 2019-06-18 VITALS — BP 125/80 | HR 85 | Temp 98.2°F | Ht 74.0 in | Wt 220.0 lb

## 2019-06-18 DIAGNOSIS — K7400 Hepatic fibrosis, unspecified: Secondary | ICD-10-CM

## 2019-06-18 DIAGNOSIS — B182 Chronic viral hepatitis C: Secondary | ICD-10-CM

## 2019-06-18 DIAGNOSIS — Z23 Encounter for immunization: Secondary | ICD-10-CM

## 2019-06-18 MED ORDER — SOFOSBUVIR-VELPATASVIR 400-100 MG PO TABS: 1.0000 | ORAL_TABLET | Freq: Every day | ORAL | 2 refills | Status: DC

## 2019-06-18 NOTE — Progress Notes (Signed)
   Subjective:    Patient ID: Randall York, male    DOB: Apr 01, 1961, 59 y.o.   MRN: [id number]  HPI Here for follow up of chronic hepatitis C Work up confirmed infection with a viral load of 3 million, genotype 1a, elastography with a kPa of 4.6 with a high probability of being normal. Fibrosure F3, A2-3.   Hepatitis B surface Ag negative.     Review of Systems  Constitutional: Negative for fatigue.  Neurological: Negative for dizziness.       Objective:   Physical Exam Eyes:     General: No scleral icterus. Neurological:     General: No focal deficit present.     Mental Status: He is alert.  Psychiatric:        Mood and Affect: Mood normal.   SH: + tobacco, alcohol        Assessment & Plan:

## 2019-06-18 NOTE — Telephone Encounter (Signed)
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 Patient is currently unemployed and being supported by family and friends. [place]CMED application was previously denied in 2020. The medication process was explained to the patient and number to Laroy Apple was provided for when they call to set up the first shipment.   Beulah Gandy, CPhT Specialty Pharmacy Patient [facility] Phone: [phone] Fax: [phone] 06/18/2019 11:18 AM

## 2019-06-18 NOTE — Patient Instructions (Signed)
Date 06/18/19  Dear Mr. Uzelac, As discussed in the ID Clinic, your hepatitis C therapy will include the following medications:    sofosbuvir 400 mg/velpatasvir 100 mg (Epclusa) oral daily  Please note that ALL MEDICATIONS WILL START ON THE SAME DATE for a total of 12 weeks. ---------------------------------------------------------------- Your HCV Treatment Start Date: TBA   Your HCV genotype: 1a   ---------------------------------------------------------------- YOUR PHARMACY CONTACT (depending on your insurance):   [facility] [address] [place], [place] [address] Phone: [phone] Hours: Monday to Friday 7:30 am to 6:00 pm   Please always contact your pharmacy at least 3-4 business days before you run out of medications to ensure your next month's medication is ready or 1 week prior to running out if you receive it by mail.  Remember, each prescription is for 28 days. ---------------------------------------------------------------- GENERAL NOTES REGARDING YOUR HEPATITIS C MEDICATION:  SOFOSBUVIR (SOVALDI or EPCLUSA): - Sofosbuvir 400 mg tablet is taken daily with OR without food. - The sofosbuvir tablets are yellow. - The Epclusa tablets are pink, diamond-shaped - The tablets should be stored at room temperature. - The most common side effects with sofosbuvir or Epclusa include:      1. Fatigue      2. Headache      3. Nausea      4. Diarrhea      5. Insomnia  - Acid reducing agents such as H2 blockers (ie. Pepcid (famotidine), Zantac (ranitidine), Tagamet (cimetidine), Axid (nizatidine) and proton pump inhibitors (ie. Prilosec (omeprazole), Protonix (pantoprazole), Nexium (esomeprazole), or Aciphex (rabeprazole)) can decrease effectiveness of Harvoni. Do not take until you have discussed with a health care provider.    -Antacids that contain magnesium and/or aluminum hydroxide (ie. Milk of Magensia, Rolaids, Gaviscon, Maalox, Mylanta, an dArthritis  Pain Formula)can reduce absorption of sofosbuvir, so take them at least 4 hours before or after Harvoni.  -Calcium carbonate (calcium supplements or antacids such as Tums, Caltrate, Os-Cal)needs to be taken at least 4 hours hours before or after sofosbuvir.  -St. John's wort or any products that contain St. John's wort like some herbal supplements  Please inform the office prior to starting any of these medications.   Please note that this only lists the most common side effects and is NOT a comprehensive list of the potential side effects of these medications. For more information, please review the drug information sheets that come with your medication package from the pharmacy.  ---------------------------------------------------------------- GENERAL HELPFUL HINTS ON HCV THERAPY: 1. No alcohol. 2. Stay well-hydrated 3. Notify the ID Clinic of any changes in your other over-the-counter/herbal or prescription medications. 4. If you miss a dose of your medication, take the missed dose as soon as you remember. Return to your regular time/dose schedule the next day.  5.  Do not stop taking your medications without first talking with your healthcare provider. 6.  You may take Tylenol (acetaminophen), as long as the dose is less than 2000 mg (OR no more than 4 tablets of the Tylenol Extra Strengths 500mg  tablet) in 24 hours. 7. You will follow up with our clinic pharmacist initially after starting the medication to monitor for any possible side effects 8. You will get labs once during treatment, soon after treatment completion and again 6 months or more after treatment completion to verify the virus is completely gone.   , MD  [facility] [address] [place], [place]  [address] [phone] 

## 2019-06-18 NOTE — Assessment & Plan Note (Signed)
elastography good and Fiborsure F3 with an elevated A3.  No indication for ongoing HCC screening.

## 2019-06-18 NOTE — Assessment & Plan Note (Signed)
I discussed pneumovax and will give today Will start hepatitis B series as well

## 2019-06-18 NOTE — Assessment & Plan Note (Signed)
I discussed the results and treatment options.  Will use Epclusa for 12 weeks.  He will follow up 4 weeks after starting treatment

## 2019-06-20 NOTE — Telephone Encounter (Signed)
[facility] Patient Advocate Encounter  Application for Randall York has been approved.    Service number: [id number] Effective dates: 06/19/2019 through 09/11/2019  Patients co-pay is $0.   [facility] will continue to follow and make a 4 week follow-up appointment when he starts the medication. Attempted to call to alert him that [facility] will be calling to set up first shipment but got his voicemail, which is currently full.   Randall York, CPhT Specialty Pharmacy Patient [facility] Phone: [phone] Fax: [phone] 06/20/2019 11:56 AM

## 2019-07-13 ENCOUNTER — Encounter: Payer: Self-pay | Admitting: Pharmacy Technician

## 2019-07-13 ENCOUNTER — Telehealth: Payer: Self-pay | Admitting: Pharmacist

## 2019-07-13 NOTE — Telephone Encounter (Signed)
Patient spoke to [place] and stated that he had some questions regarding Epclusa. Called him back to discuss, no answer, left HIPAA compliant voicemail.

## 2019-07-17 ENCOUNTER — Telehealth: Payer: Self-pay

## 2019-07-30 ENCOUNTER — Ambulatory Visit: Payer: Self-pay | Admitting: Pharmacist

## 2019-07-31 ENCOUNTER — Other Ambulatory Visit: Payer: Self-pay

## 2019-07-31 ENCOUNTER — Ambulatory Visit: Payer: HRSA Program | Attending: Internal Medicine

## 2019-07-31 DIAGNOSIS — Z20822 Contact with and (suspected) exposure to covid-19: Secondary | ICD-10-CM | POA: Diagnosis present

## 2019-08-01 LAB — SARS-COV-2, NAA 2 DAY TAT

## 2019-08-01 LAB — NOVEL CORONAVIRUS, NAA: SARS-CoV-2, NAA: NOT DETECTED

## 2019-08-02 ENCOUNTER — Ambulatory Visit: Payer: Self-pay | Admitting: Pharmacist

## 2019-08-04 ENCOUNTER — Telehealth: Payer: Self-pay | Admitting: Internal Medicine

## 2019-08-04 NOTE — Telephone Encounter (Signed)
Patient called in and received his negative covid test result  °

## 2019-10-04 IMAGING — DX DG ABDOMEN 1V
2 series · 2 of 2 positions shown · non-contrast
Comparison: None.

CLINICAL DATA: Severe right flank pain.

EXAM:
ABDOMEN - 1 VIEW

[abdomen kub (1 of 2)]
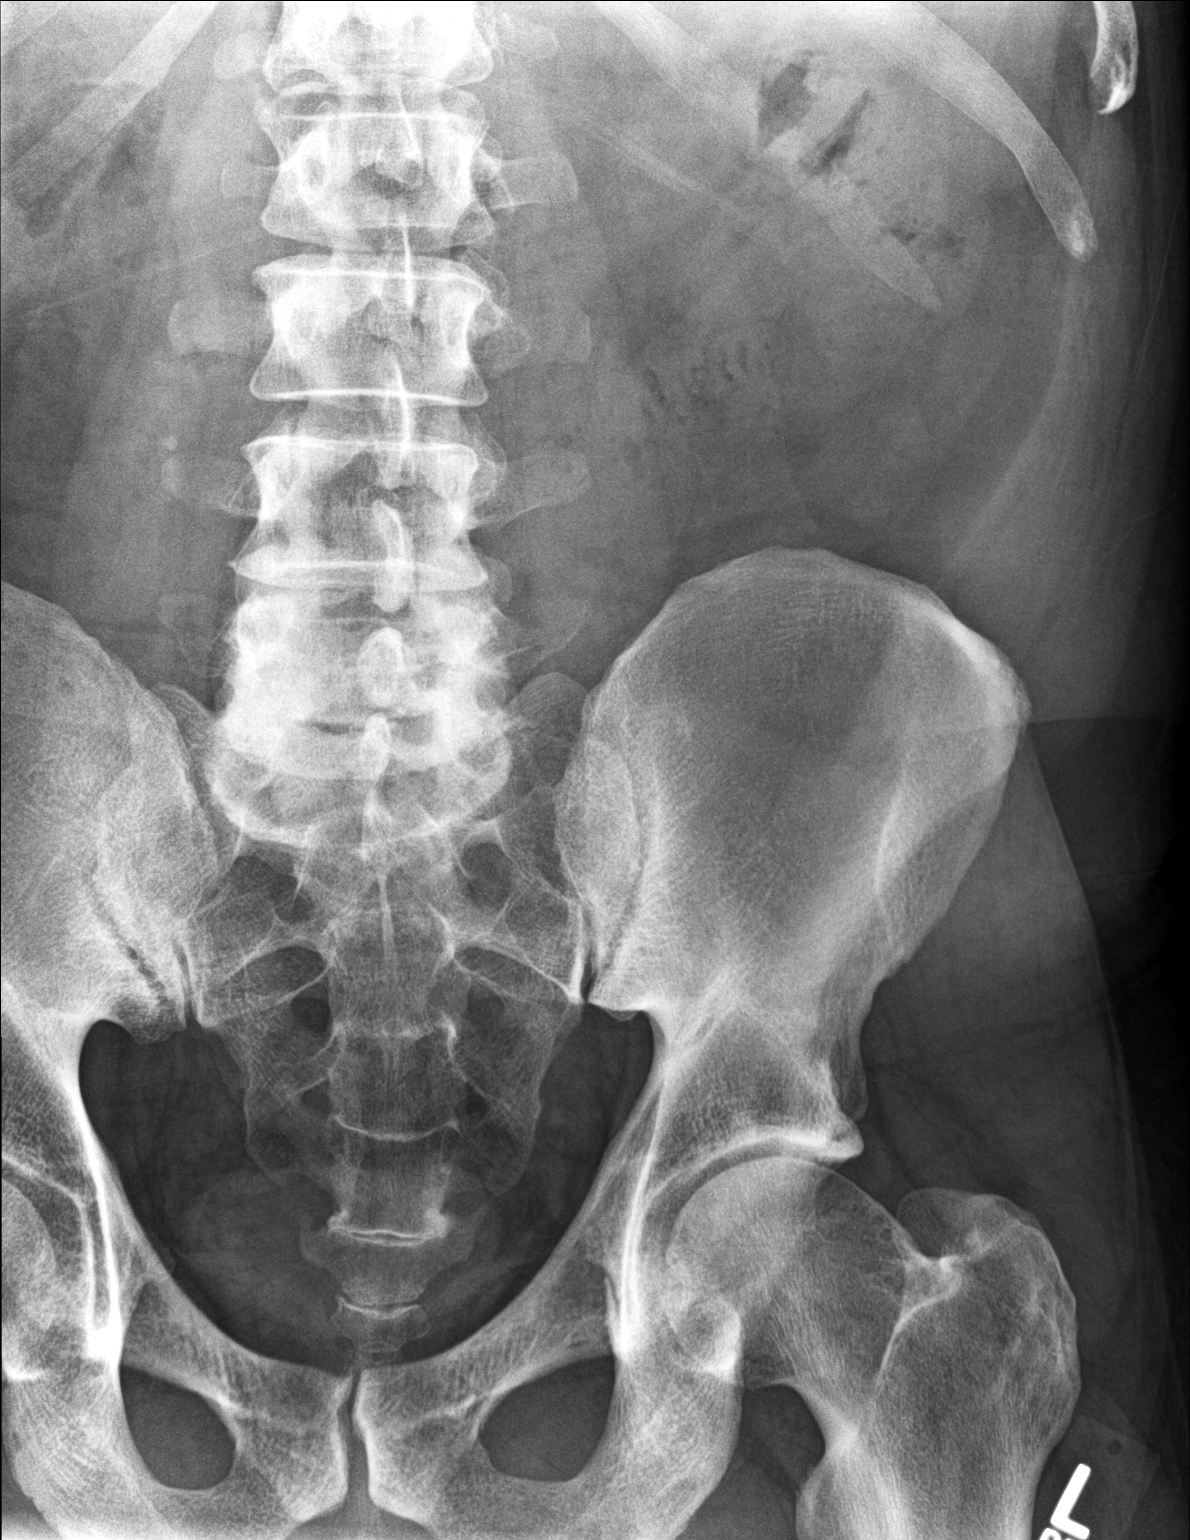

[abdomen kub (2 of 2)]
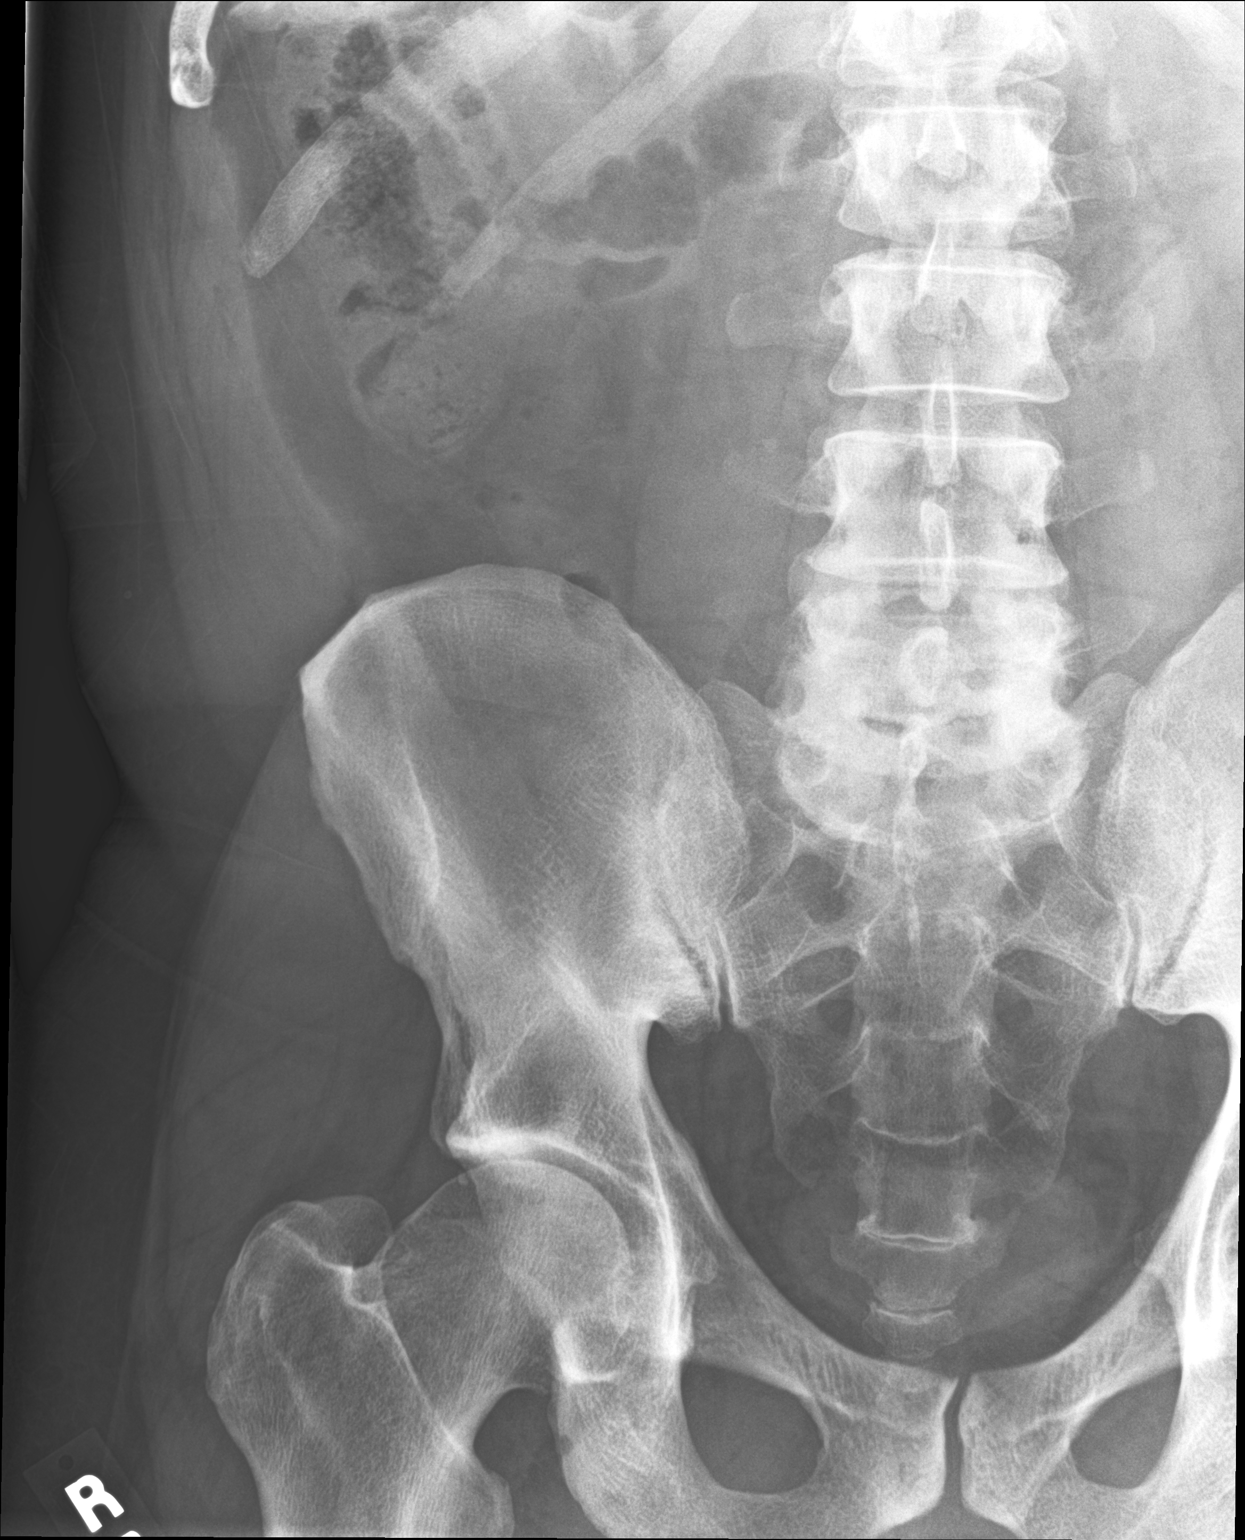

[2 of 2 positions shown; findings below may reference images not displayed]

FINDINGS: Two views the upper abdomen. Of the abdomen pelvis do not include
the kidneys are not included in their entirety. There is a 4 mm
calcification in the expected position of the proximal right ureter.
Otherwise, no urinary tract calculi are visualized. Normal bowel gas
pattern. Unremarkable bones.
IMPRESSION: Probable 4 mm proximal right ureteral calculus.

## 2019-10-06 IMAGING — DX DG ABDOMEN 1V
2 series · 2 of 2 positions shown · non-contrast
Comparison: 11/30/2017 abdomen radiograph

CLINICAL DATA: 57 y/o  M; right flank pain.

EXAM:
ABDOMEN - 1 VIEW

[abdomen kub (1 of 2)]
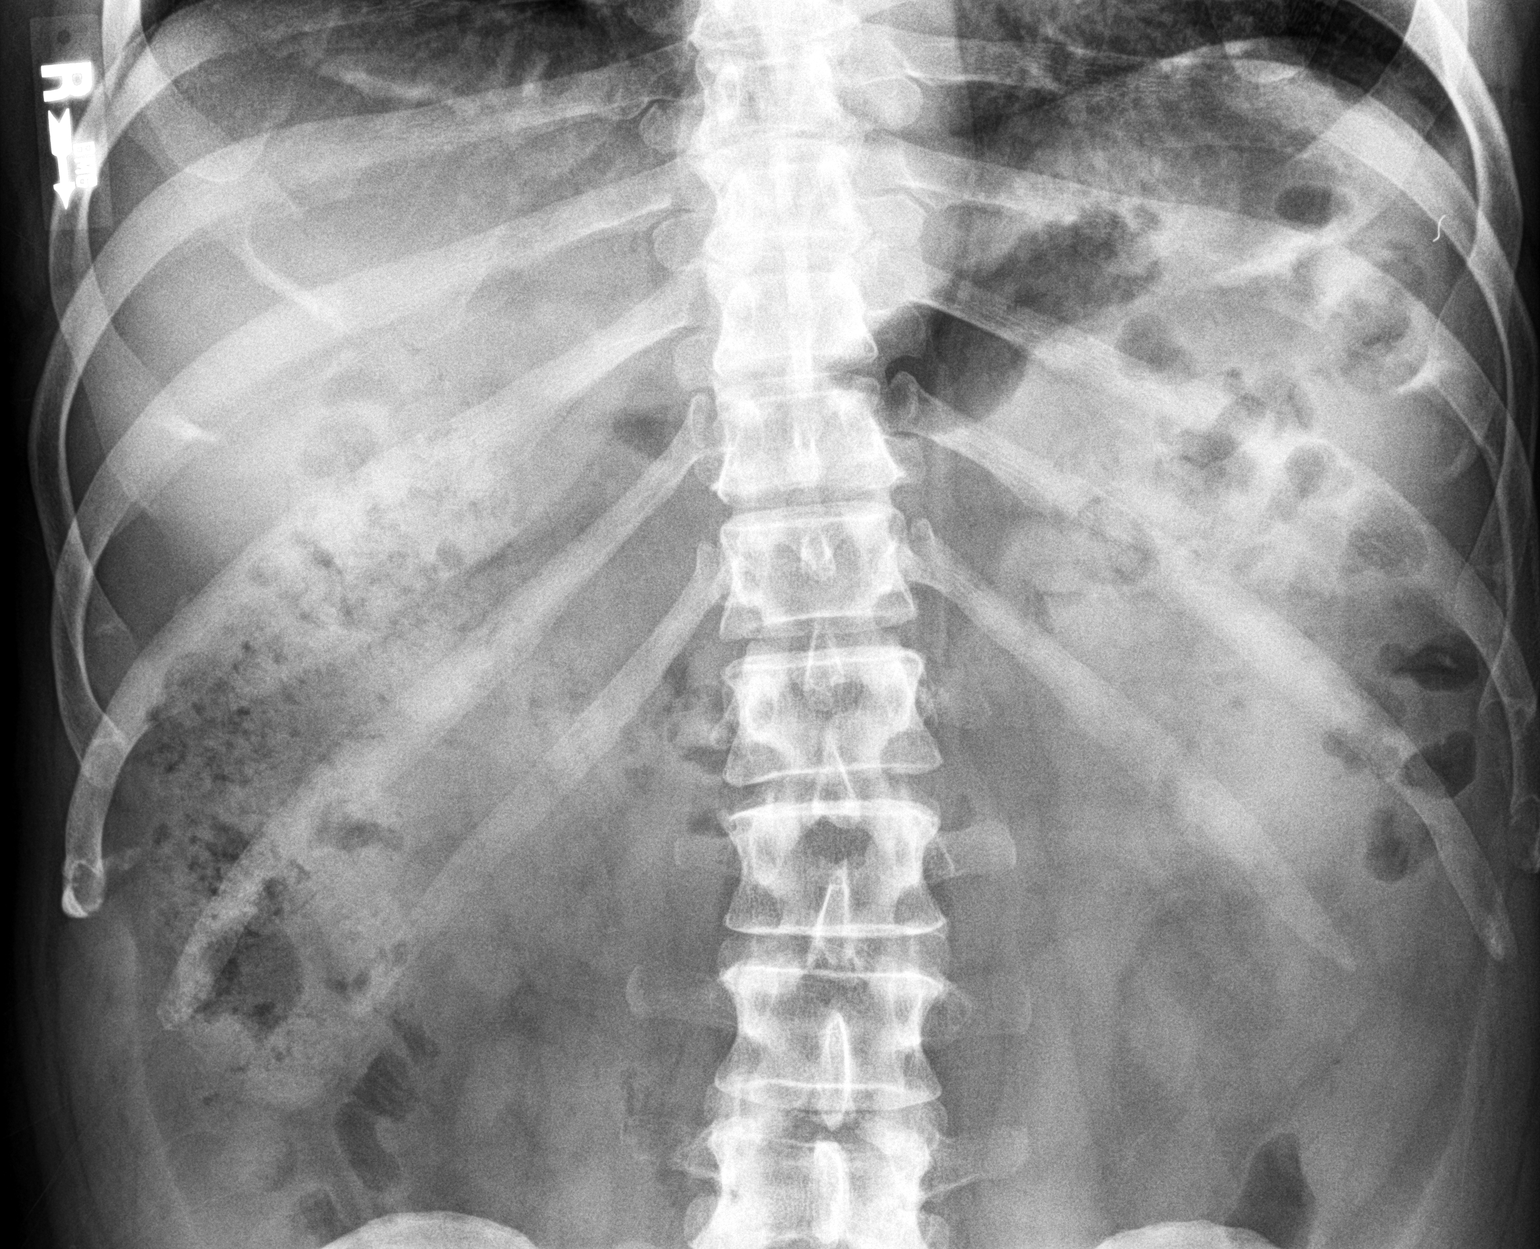

[abdomen kub (2 of 2)]
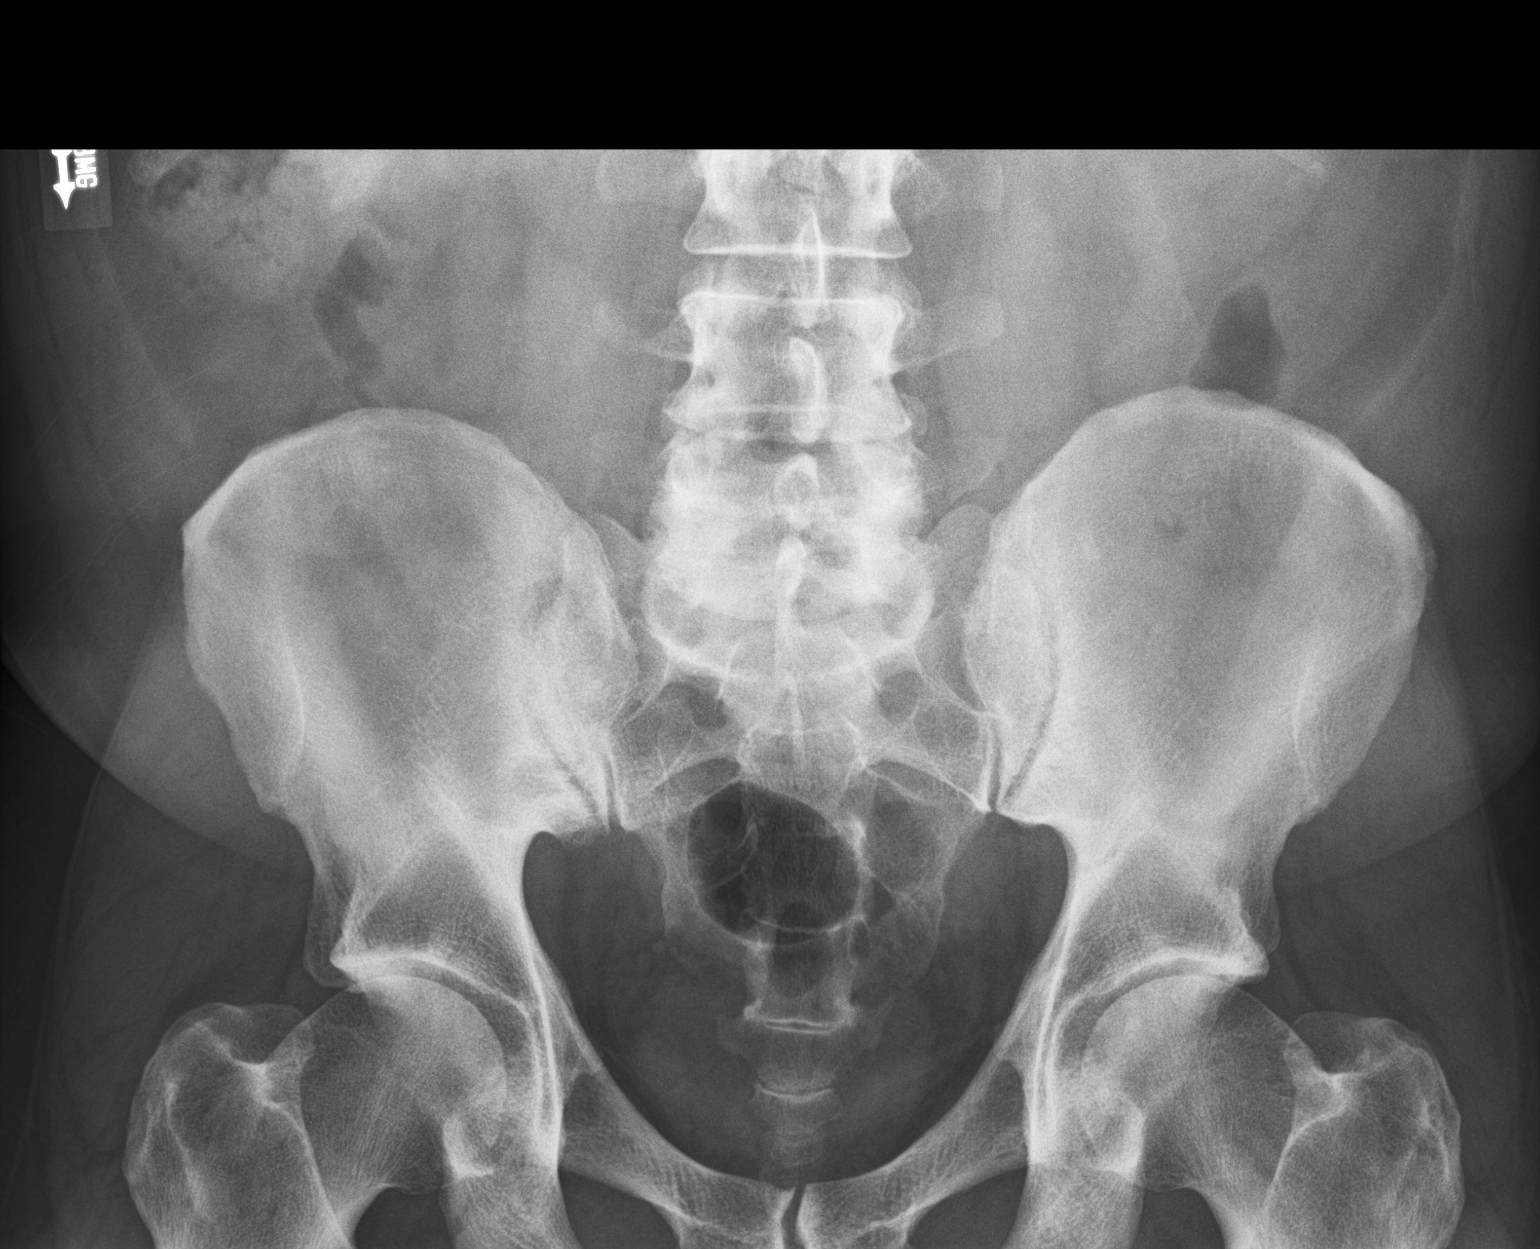

[2 of 2 positions shown; findings below may reference images not displayed]

FINDINGS: 4 mm density in the right hemipelvis may represent the probable
right-sided ureteral calculus identified on a prior abdomen
radiographs, now migrated to the distal ureter. Normal bowel gas
pattern. Bones are unremarkable.
IMPRESSION: 4 mm density in the right hemipelvis may represent the probable
right-sided ureteral calculus identified on a prior abdomen
radiographs, now migrated to the distal ureter.

By: Lamberto Laso M.D.

## 2020-01-07 ENCOUNTER — Telehealth: Payer: Self-pay

## 2020-01-07 NOTE — Telephone Encounter (Signed)
Thank you :)

## 2020-01-07 NOTE — Telephone Encounter (Signed)
Patient states he would like to complete Hep C treatment. Patient currently in a program in [facility] and will complete program next Tuesday. Patient scheduled with Pharm-D next week. Valarie Cones

## 2020-01-16 ENCOUNTER — Ambulatory Visit: Payer: Self-pay | Admitting: Pharmacist

## 2020-01-16 ENCOUNTER — Other Ambulatory Visit: Payer: Self-pay

## 2020-01-16 DIAGNOSIS — B182 Chronic viral hepatitis C: Secondary | ICD-10-CM

## 2020-01-16 NOTE — Progress Notes (Signed)
HPI: Randall York is a 59 y.o. male who presents to the [facility] pharmacy clinic for Hepatitis C follow-up.  Medication: Epclusa x 12 weeks  Start Date: 06/26/19  Hepatitis C Genotype: 1a  Fibrosis Score: F3  Hepatitis C RNA: 3 million on 06/04/19  Patient Active Problem List   Diagnosis Date Noted  . Need for prophylactic vaccination against Streptococcus pneumoniae (pneumococcus) 06/18/2019  . Liver fibrosis 06/18/2019  . Chronic hepatitis C without hepatic coma (HCC) 06/04/2019  . Abrasions of multiple sites 11/18/2018    Patient's Medications  New Prescriptions   No medications on file  Previous Medications   CYCLOBENZAPRINE (FEXMID) 7.5 MG TABLET    Take 7.5 mg by mouth 3 (three) times daily as needed for muscle spasms.   IBUPROFEN (ADVIL) 800 MG TABLET    Take 800 mg by mouth 3 (three) times daily as needed.   METHYLPREDNISOLONE (MEDROL DOSEPAK) 4 MG TBPK TABLET    Medrol dose pack taper   OXYCODONE-ACETAMINOPHEN (PERCOCET) 7.5-325 MG TABLET    Take 1 tablet by mouth every 8 (eight) hours as needed.   SOFOSBUVIR-VELPATASVIR (EPCLUSA) 400-100 MG TABS    Take 1 tablet by mouth daily.   TRIAMCINOLONE CREAM (KENALOG) 0.1 %    Apply 1 application topically 2 (two) times daily.  Modified Medications   No medications on file  Discontinued Medications   No medications on file    Allergies: No Known Allergies  Past Medical History: Past Medical History:  Diagnosis Date  . Hepatitis C   . Kidney stone     Social History: Social History   Socioeconomic History  . Marital status: Married    Spouse name: Not on file  . Number of children: Not on file  . Years of education: Not on file  . Highest education level: Not on file  Occupational History  . Not on file  Tobacco Use  . Smoking status: Current Some Day Smoker    Types: Cigars  . Smokeless tobacco: Never Used  Vaping Use  . Vaping Use: Never used  Substance and Sexual Activity  . Alcohol use: Yes     Comment: every now and then  . Drug use: Yes    Types: Cocaine, Marijuana  . Sexual activity: Yes    Partners: Female  Other Topics Concern  . Not on file  Social History Narrative  . Not on file   Social Determinants of Health   Financial Resource Strain:   . Difficulty of Paying Living Expenses: Not on file  Food Insecurity:   . Worried About Programme researcher, broadcasting/film/video in the Last Year: Not on file  . Ran Out of Food in the Last Year: Not on file  Transportation Needs:   . Lack of Transportation (Medical): Not on file  . Lack of Transportation (Non-Medical): Not on file  Physical Activity:   . Days of Exercise per Week: Not on file  . Minutes of Exercise per Session: Not on file  Stress:   . Feeling of Stress : Not on file  Social Connections:   . Frequency of Communication with Friends and Family: Not on file  . Frequency of Social Gatherings with Friends and Family: Not on file  . Attends Religious Services: Not on file  . Active Member of Clubs or Organizations: Not on file  . Attends Banker Meetings: Not on file  . Marital Status: Not on file    Labs: Hepatitis C Lab Results  Component Value Date   HCVGENOTYPE 1a 06/04/2019   HCVRNAPCRQN 3,030,000 (H) 06/04/2019   FIBROSTAGE F3 06/04/2019   Hepatitis B Lab Results  Component Value Date   HEPBSAB NON-REACTIVE 06/04/2019   HEPBSAG NON REACTIVE 06/01/2019   HEPBCAB NON-REACTIVE 06/04/2019   Hepatitis A Lab Results  Component Value Date   HAV REACTIVE (A) 06/04/2019   HIV Lab Results  Component Value Date   HIV NON-REACTIVE 06/04/2019   HIV Non Reactive 11/18/2018   Lab Results  Component Value Date   CREATININE 0.88 05/31/2019   CREATININE 2.86 (H) 11/18/2018   CREATININE 1.38 (H) 12/08/2017   CREATININE 1.29 (H) 12/06/2017   CREATININE 0.93 11/30/2017   Lab Results  Component Value Date   AST 130 (H) 05/31/2019   ALT 81 (H) 06/04/2019   ALT 130 (H) 05/31/2019   INR 1.0 05/31/2019     Assessment: Randall York is here today to follow up for his Hep C infection. He was prescribed Epclusa for 12 weeks by Dr. Luciana Axe back in February of this year. He started taking it in March but never followed up with our phone calls and missed his appointments.  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    He states that he took all three months of Epclusa and did not miss a single dose. He had some fatigue and stomach issues to start but tolerated it well. He received all three months from [facility] on time and did not have any lapses. He had labs drawn at a clinic at [facility] last week, and they told him that his Hepatitis C was positive.  I am thinking that they may have checked a Hep C antibody, which will be positive for him lifelong since he has been exposed to the virus. Explained this to him. I will check a Hep C viral load today to see if he is cured. I will call him when the labs have resulted.  If we need to treat him again, we can. He is in agreement with the plan.  Plan: - Hep C RNA today to see if cured - Call with results  Safiyya Stokes L. Jahari Wiginton, PharmD, BCIDP, AAHIVP, CPP Clinical Pharmacist Practitioner Infectious Diseases Clinical Pharmacist [facility] 01/16/2020, 1:55 PM

## 2020-01-20 LAB — HEPATITIS C RNA QUANTITATIVE
HCV RNA, PCR, QN (Log): <1.18 log IU/mL
HCV RNA, PCR, QN: <15 IU/mL

## 2020-01-20 LAB — COMPREHENSIVE METABOLIC PANEL
AG Ratio: 1.5 (calc) (ref 1.0–2.5)
ALT: 15 U/L (ref 9–46)
AST: 20 U/L (ref 10–35)
Albumin: 4.5 g/dL (ref 3.6–5.1)
Alkaline phosphatase (APISO): 48 U/L (ref 35–144)
BUN: 12 mg/dL (ref 7–25)
CO2: 27 mmol/L (ref 20–32)
Calcium: 10 mg/dL (ref 8.6–10.3)
Chloride: 101 mmol/L (ref 98–110)
Creat: 0.97 mg/dL (ref 0.70–1.33)
Globulin: 3 g/dL (calc) (ref 1.9–3.7)
Glucose, Bld: 184 mg/dL — ABNORMAL HIGH (ref 65–99)
Potassium: 4.1 mmol/L (ref 3.5–5.3)
Sodium: 140 mmol/L (ref 135–146)
Total Bilirubin: 0.3 mg/dL (ref 0.2–1.2)
Total Protein: 7.5 g/dL (ref 6.1–8.1)

## 2020-01-22 ENCOUNTER — Telehealth: Payer: Self-pay | Admitting: Pharmacist

## 2020-01-22 NOTE — Telephone Encounter (Signed)
Called patient to relay results. His Hepatitis C RNA was undetectable indicating that he is cured of his Hepatitis C. No further treatment or follow up needed. Explained that he could get reinfected if he participated in risky behavior. All questions answered.
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
## 2022-04-14 DIAGNOSIS — M25531 Pain in right wrist: Secondary | ICD-10-CM | POA: Diagnosis not present

## 2022-04-14 DIAGNOSIS — S61551A Open bite of right wrist, initial encounter: Secondary | ICD-10-CM | POA: Diagnosis not present

## 2022-04-14 DIAGNOSIS — W540XXA Bitten by dog, initial encounter: Secondary | ICD-10-CM | POA: Diagnosis not present

## 2022-12-21 DIAGNOSIS — M79641 Pain in right hand: Secondary | ICD-10-CM | POA: Diagnosis not present

## 2022-12-21 DIAGNOSIS — F17210 Nicotine dependence, cigarettes, uncomplicated: Secondary | ICD-10-CM | POA: Diagnosis not present

## 2022-12-21 DIAGNOSIS — Z79899 Other long term (current) drug therapy: Secondary | ICD-10-CM | POA: Diagnosis not present

## 2022-12-21 DIAGNOSIS — M7989 Other specified soft tissue disorders: Secondary | ICD-10-CM | POA: Diagnosis not present

## 2023-12-22 ENCOUNTER — Ambulatory Visit: Payer: MEDICAID | Admitting: Orthopedic Surgery
